# Patient Record
Sex: Male | Born: 1935 | Race: White | Hispanic: No | Smoking: Former smoker
Health system: Southern US, Community
[De-identification: ages and names within clinical notes are randomized; demographics above are authoritative.]

## PROBLEM LIST (undated history)

## (undated) DIAGNOSIS — E7800 Pure hypercholesterolemia, unspecified: Secondary | ICD-10-CM

## (undated) DIAGNOSIS — I2510 Atherosclerotic heart disease of native coronary artery without angina pectoris: Secondary | ICD-10-CM

## (undated) DIAGNOSIS — I878 Other specified disorders of veins: Secondary | ICD-10-CM

## (undated) DIAGNOSIS — G459 Transient cerebral ischemic attack, unspecified: Secondary | ICD-10-CM

## (undated) DIAGNOSIS — I509 Heart failure, unspecified: Secondary | ICD-10-CM

## (undated) DIAGNOSIS — M109 Gout, unspecified: Secondary | ICD-10-CM

## (undated) DIAGNOSIS — I4891 Unspecified atrial fibrillation: Secondary | ICD-10-CM

## (undated) DIAGNOSIS — I10 Essential (primary) hypertension: Secondary | ICD-10-CM

## (undated) DIAGNOSIS — R42 Dizziness and giddiness: Secondary | ICD-10-CM

## (undated) DIAGNOSIS — I219 Acute myocardial infarction, unspecified: Secondary | ICD-10-CM

## (undated) HISTORY — PX: OTHER SURGICAL HISTORY: SHX169

## (undated) HISTORY — PX: BACK SURGERY: SHX140

## (undated) HISTORY — PX: CARDIAC CATHETERIZATION: SHX172

---
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
## 2007-07-27 DIAGNOSIS — G459 Transient cerebral ischemic attack, unspecified: Secondary | ICD-10-CM

## 2007-07-27 HISTORY — DX: Transient cerebral ischemic attack, unspecified: G45.9

## 2012-07-26 HISTORY — PX: BACK SURGERY: SHX140

## 2013-03-05 ENCOUNTER — Ambulatory Visit (INDEPENDENT_AMBULATORY_CARE_PROVIDER_SITE_OTHER): Payer: Medicare Other | Admitting: Internal Medicine

## 2013-03-07 ENCOUNTER — Ambulatory Visit (INDEPENDENT_AMBULATORY_CARE_PROVIDER_SITE_OTHER): Payer: Self-pay | Admitting: Internal Medicine

## 2013-03-12 ENCOUNTER — Ambulatory Visit (INDEPENDENT_AMBULATORY_CARE_PROVIDER_SITE_OTHER): Payer: Medicare Other | Admitting: Internal Medicine

## 2013-10-09 ENCOUNTER — Encounter (INDEPENDENT_AMBULATORY_CARE_PROVIDER_SITE_OTHER): Payer: Self-pay | Admitting: Specialist

## 2013-10-10 ENCOUNTER — Ambulatory Visit (INDEPENDENT_AMBULATORY_CARE_PROVIDER_SITE_OTHER): Payer: Medicare Other | Admitting: Specialist

## 2013-10-10 ENCOUNTER — Encounter (INDEPENDENT_AMBULATORY_CARE_PROVIDER_SITE_OTHER): Payer: Self-pay | Admitting: Specialist

## 2013-10-10 VITALS — BP 136/76 | HR 55 | Ht 68.0 in | Wt 206.0 lb

## 2013-10-10 DIAGNOSIS — I872 Venous insufficiency (chronic) (peripheral): Secondary | ICD-10-CM

## 2013-10-10 HISTORY — DX: Venous insufficiency (chronic) (peripheral): I87.2

## 2013-10-10 NOTE — Progress Notes (Signed)
[facility]    Chief Complaint   Patient presents with   . Follow-up     ultrasound ll venous dopple FRC         History of Present Illness     S/p bilateral long saphenous ablation has no complaints all of his symptoms have resolved      Past Medical History     Past Medical History   Diagnosis Date   . Venous insufficiency 10/10/2013       Allergies     No Known Allergies    Medications     No current outpatient prescriptions on file prior to visit.       Review of Systems     Constitutional: Negative for fevers and chills  Skin: No rash or lesions  Respiratory: Negative for cough, wheezing, or hemoptysis  Cardiovascular: as per HPI  Gastrointestinal: Negative for abdominal pain, nausea, vomiting and diarrhea  Musculoskeletal:  No arthritic symptoms  Genitourinary: Negative for dysuria  All other systems were reviewed and are negative      Physical Exam     Filed Vitals:    10/10/13 1404   BP: 136/76   Pulse: 55       Body mass index is 31.33 kg/(m^2).    General: Patient appears their stated age, well-nourished. Alert and in no apparent distress.  HEENT: No conjunctivitis, no purulent discharge, no lid lag, non icteric scerlae, EOMI, Hearing grossly intact, Nares patent bilaterally, Lips moist, color appropriate for race.  Lungs: Respiratory effort unlabored, chest expansion symmetric.  Cardiac: RRR, no carotid bruits, no JVD. Extremities warm, Right Femoral pulses 2+, Left Femoral 2+, Right popliteal 2+, Left popliteal 2+, Right DP 2+, Left DP 2+, Right PT 2+, Left PT 2+, no peripheral edema:Abd: Soft, nondistended, nontender. No guarding or rebound, No mid line pulsatile mass   UVO:ZDGU ROM, symmetric  Skin: Color appropriate for race, Skin warm, dry, no gangrene, no non healing ulcers, no varicose veins , no hyperpigmentation, no lipo-dermatosclerosis  Neuro: Good insight and judgment, oriented to person, place, and time CN II-XII intact, gross motor and sensory intact      Labs     CBC:   No  results found for this basename: WBC, RBC, HGB, HCT, MCV, MCHC, RDW, PLT       CMP:   No results found for this basename: NA, K, CL, CO2, GLU, BUN, CREATININE, CALCIUM, PROT, ALBUMIN, BILITOT, ALKPHOS, AST, ALT, ANIONGAP, GRFNONAFAMER, GFRAFAMER       Lipid Panel   No results found for this basename: chol, trig, hdl, ldlc, vldlc       Coags:   No results found for this basename: PT, INR, PTT       Assessment and Plan       1. Venous insufficiency        Doing well post ablation therapy. Follow up as needed.

## 2014-04-10 ENCOUNTER — Inpatient Hospital Stay: Payer: Medicare Other | Admitting: Rehabilitative and Restorative Service Providers"

## 2014-04-12 ENCOUNTER — Ambulatory Visit: Payer: Medicare Other

## 2014-04-16 ENCOUNTER — Ambulatory Visit: Payer: Medicare Other

## 2014-04-18 ENCOUNTER — Ambulatory Visit: Payer: Medicare Other | Admitting: Rehabilitative and Restorative Service Providers"

## 2014-04-23 ENCOUNTER — Ambulatory Visit: Payer: Medicare Other

## 2014-04-25 ENCOUNTER — Ambulatory Visit: Payer: Medicare Other | Admitting: Rehabilitative and Restorative Service Providers"

## 2014-04-30 ENCOUNTER — Ambulatory Visit: Payer: Medicare Other | Admitting: Rehabilitative and Restorative Service Providers"

## 2016-02-02 ENCOUNTER — Other Ambulatory Visit: Payer: Self-pay | Admitting: Cardiology

## 2016-02-02 ENCOUNTER — Ambulatory Visit
Admission: RE | Admit: 2016-02-02 | Discharge: 2016-02-02 | Disposition: A | Discharge status: Home / Self Care | Payer: Medicare Other | Source: Ambulatory Visit | Attending: Cardiology | Admitting: Cardiology

## 2016-02-02 DIAGNOSIS — R52 Pain, unspecified: Secondary | ICD-10-CM

## 2016-05-21 ENCOUNTER — Other Ambulatory Visit: Payer: Self-pay | Admitting: Cardiology

## 2016-05-21 DIAGNOSIS — R079 Chest pain, unspecified: Secondary | ICD-10-CM

## 2016-05-31 ENCOUNTER — Encounter (HOSPITAL_COMMUNITY)
Admission: RE | Admit: 2016-05-31 | Discharge: 2016-05-31 | Disposition: A | Discharge status: Home / Self Care | Payer: Medicare Other | Source: Ambulatory Visit | Attending: Cardiology | Admitting: Cardiology

## 2016-05-31 ENCOUNTER — Ambulatory Visit (HOSPITAL_COMMUNITY)
Admission: RE | Admit: 2016-05-31 | Discharge: 2016-05-31 | Disposition: A | Discharge status: Home / Self Care | Payer: Medicare Other | Source: Ambulatory Visit | Attending: Cardiology | Admitting: Cardiology

## 2016-05-31 DIAGNOSIS — R079 Chest pain, unspecified: Secondary | ICD-10-CM | POA: Diagnosis not present

## 2016-05-31 MED ORDER — REGADENOSON 0.4 MG/5ML IV SOLN
0.4000 mg | Freq: Once | INTRAVENOUS | Status: AC
  Administered 2016-05-31: 0.4 mg via INTRAVENOUS

## 2016-05-31 MED ORDER — TECHNETIUM TC 99M TETROFOSMIN IV KIT
30.0000 | PACK | Freq: Once | INTRAVENOUS | Status: AC | PRN
  Administered 2016-05-31: 30 via INTRAVENOUS

## 2016-05-31 MED ORDER — TECHNETIUM TC 99M TETROFOSMIN IV KIT
10.0000 | PACK | Freq: Once | INTRAVENOUS | Status: AC | PRN
  Administered 2016-05-31: 10 via INTRAVENOUS

## 2016-05-31 MED ORDER — REGADENOSON 0.4 MG/5ML IV SOLN
INTRAVENOUS | Status: AC
  Filled 2016-05-31: qty 5

## 2016-06-22 ENCOUNTER — Encounter (HOSPITAL_COMMUNITY)
Admission: RE | Disposition: A | Discharge status: Home / Self Care | Payer: Self-pay | Source: Ambulatory Visit | Attending: Cardiology

## 2016-06-22 ENCOUNTER — Ambulatory Visit (HOSPITAL_COMMUNITY)
Admission: RE | Admit: 2016-06-22 | Discharge: 2016-06-22 | Disposition: A | Discharge status: Home / Self Care | Payer: Medicare Other | Source: Ambulatory Visit | Attending: Cardiology | Admitting: Cardiology

## 2016-06-22 DIAGNOSIS — E785 Hyperlipidemia, unspecified: Secondary | ICD-10-CM | POA: Diagnosis not present

## 2016-06-22 DIAGNOSIS — I252 Old myocardial infarction: Secondary | ICD-10-CM | POA: Insufficient documentation

## 2016-06-22 DIAGNOSIS — Z8673 Personal history of transient ischemic attack (TIA), and cerebral infarction without residual deficits: Secondary | ICD-10-CM | POA: Diagnosis not present

## 2016-06-22 DIAGNOSIS — Z7901 Long term (current) use of anticoagulants: Secondary | ICD-10-CM | POA: Insufficient documentation

## 2016-06-22 DIAGNOSIS — I482 Chronic atrial fibrillation: Secondary | ICD-10-CM | POA: Diagnosis not present

## 2016-06-22 DIAGNOSIS — Z6834 Body mass index (BMI) 34.0-34.9, adult: Secondary | ICD-10-CM | POA: Insufficient documentation

## 2016-06-22 DIAGNOSIS — I25119 Atherosclerotic heart disease of native coronary artery with unspecified angina pectoris: Secondary | ICD-10-CM | POA: Insufficient documentation

## 2016-06-22 DIAGNOSIS — Z823 Family history of stroke: Secondary | ICD-10-CM | POA: Insufficient documentation

## 2016-06-22 DIAGNOSIS — Z79899 Other long term (current) drug therapy: Secondary | ICD-10-CM | POA: Insufficient documentation

## 2016-06-22 DIAGNOSIS — Z87891 Personal history of nicotine dependence: Secondary | ICD-10-CM | POA: Diagnosis not present

## 2016-06-22 DIAGNOSIS — E669 Obesity, unspecified: Secondary | ICD-10-CM | POA: Diagnosis not present

## 2016-06-22 DIAGNOSIS — Z9889 Other specified postprocedural states: Secondary | ICD-10-CM | POA: Insufficient documentation

## 2016-06-22 DIAGNOSIS — M1990 Unspecified osteoarthritis, unspecified site: Secondary | ICD-10-CM | POA: Insufficient documentation

## 2016-06-22 DIAGNOSIS — Z8249 Family history of ischemic heart disease and other diseases of the circulatory system: Secondary | ICD-10-CM | POA: Diagnosis not present

## 2016-06-22 DIAGNOSIS — M109 Gout, unspecified: Secondary | ICD-10-CM | POA: Insufficient documentation

## 2016-06-22 DIAGNOSIS — I10 Essential (primary) hypertension: Secondary | ICD-10-CM | POA: Diagnosis not present

## 2016-06-22 HISTORY — PX: CARDIAC CATHETERIZATION: SHX172

## 2016-06-22 LAB — BASIC METABOLIC PANEL
ANION GAP: 10 (ref 5–15)
BUN: 34 mg/dL — ABNORMAL HIGH (ref 6–20)
CHLORIDE: 104 mmol/L (ref 101–111)
CO2: 24 mmol/L (ref 22–32)
CREATININE: 1.35 mg/dL — AB (ref 0.61–1.24)
Calcium: 9.1 mg/dL (ref 8.9–10.3)
GFR calc non Af Amer: 48 mL/min — ABNORMAL LOW (ref >60)
GFR, EST AFRICAN AMERICAN: 56 mL/min — AB (ref >60)
Glucose, Bld: 90 mg/dL (ref 65–99)
Potassium: 4.2 mmol/L (ref 3.5–5.1)
Sodium: 138 mmol/L (ref 135–145)

## 2016-06-22 LAB — CBC
HCT: 47.9 % (ref 39.0–52.0)
HEMOGLOBIN: 16.3 g/dL (ref 13.0–17.0)
MCH: 32 pg (ref 26.0–34.0)
MCHC: 34 g/dL (ref 30.0–36.0)
MCV: 93.9 fL (ref 78.0–100.0)
Platelets: 143 10*3/uL — ABNORMAL LOW (ref 150–400)
RBC: 5.1 MIL/uL (ref 4.22–5.81)
RDW: 13.7 % (ref 11.5–15.5)
WBC: 5.3 10*3/uL (ref 4.0–10.5)

## 2016-06-22 LAB — PROTIME-INR
INR: 1.41
Prothrombin Time: 17.4 seconds — ABNORMAL HIGH (ref 11.4–15.2)

## 2016-06-22 SURGERY — LEFT HEART CATH AND CORONARY ANGIOGRAPHY
Anesthesia: LOCAL

## 2016-06-22 MED ORDER — FENTANYL CITRATE (PF) 100 MCG/2ML IJ SOLN
INTRAMUSCULAR | Status: AC
  Filled 2016-06-22: qty 2

## 2016-06-22 MED ORDER — FENTANYL CITRATE (PF) 100 MCG/2ML IJ SOLN
INTRAMUSCULAR | Status: DC | PRN
  Administered 2016-06-22: 25 ug via INTRAVENOUS

## 2016-06-22 MED ORDER — MIDAZOLAM HCL 2 MG/2ML IJ SOLN
INTRAMUSCULAR | Status: DC | PRN
  Administered 2016-06-22: 1 mg via INTRAVENOUS

## 2016-06-22 MED ORDER — OXYCODONE-ACETAMINOPHEN 5-325 MG PO TABS: 1.0000 | ORAL_TABLET | ORAL | Status: DC | PRN

## 2016-06-22 MED ORDER — ASPIRIN 81 MG PO CHEW
CHEWABLE_TABLET | ORAL | Status: AC
  Administered 2016-06-22: 81 mg via ORAL
  Filled 2016-06-22: qty 1

## 2016-06-22 MED ORDER — SODIUM CHLORIDE 0.9 % WEIGHT BASED INFUSION
3.0000 mL/kg/h | INTRAVENOUS | Status: DC
  Administered 2016-06-22: 3 mL/kg/h via INTRAVENOUS

## 2016-06-22 MED ORDER — SODIUM CHLORIDE 0.9 % IV SOLN: INTRAVENOUS | Status: AC

## 2016-06-22 MED ORDER — SODIUM CHLORIDE 0.9% FLUSH: 3.0000 mL | Freq: Two times a day (BID) | INTRAVENOUS | Status: DC

## 2016-06-22 MED ORDER — SODIUM CHLORIDE 0.9 % IV SOLN: 250.0000 mL | INTRAVENOUS | Status: DC | PRN

## 2016-06-22 MED ORDER — SODIUM CHLORIDE 0.9% FLUSH: 3.0000 mL | INTRAVENOUS | Status: DC | PRN

## 2016-06-22 MED ORDER — LIDOCAINE HCL (PF) 1 % IJ SOLN
INTRAMUSCULAR | Status: DC | PRN
  Administered 2016-06-22: 15 mL

## 2016-06-22 MED ORDER — MIDAZOLAM HCL 2 MG/2ML IJ SOLN
INTRAMUSCULAR | Status: AC
  Filled 2016-06-22: qty 2

## 2016-06-22 MED ORDER — IOPAMIDOL (ISOVUE-370) INJECTION 76%
INTRAVENOUS | Status: AC
  Filled 2016-06-22: qty 100

## 2016-06-22 MED ORDER — ACETAMINOPHEN 325 MG PO TABS: 650.0000 mg | ORAL_TABLET | ORAL | Status: DC | PRN

## 2016-06-22 MED ORDER — IOPAMIDOL (ISOVUE-370) INJECTION 76%
INTRAVENOUS | Status: DC | PRN
  Administered 2016-06-22: 90 mL via INTRA_ARTERIAL

## 2016-06-22 MED ORDER — LIDOCAINE HCL (PF) 1 % IJ SOLN
INTRAMUSCULAR | Status: AC
  Filled 2016-06-22: qty 30

## 2016-06-22 MED ORDER — HEPARIN (PORCINE) IN NACL 2-0.9 UNIT/ML-% IJ SOLN
INTRAMUSCULAR | Status: DC | PRN
  Administered 2016-06-22: 1000 mL

## 2016-06-22 MED ORDER — ONDANSETRON HCL 4 MG/2ML IJ SOLN: 4.0000 mg | Freq: Four times a day (QID) | INTRAMUSCULAR | Status: DC | PRN

## 2016-06-22 MED ORDER — HEPARIN (PORCINE) IN NACL 2-0.9 UNIT/ML-% IJ SOLN
INTRAMUSCULAR | Status: AC
  Filled 2016-06-22: qty 1000

## 2016-06-22 MED ORDER — SODIUM CHLORIDE 0.9 % WEIGHT BASED INFUSION: 1.0000 mL/kg/h | INTRAVENOUS | Status: DC

## 2016-06-22 MED ORDER — SODIUM CHLORIDE 0.9 % IV SOLN
INTRAVENOUS | Status: DC | PRN
  Administered 2016-06-22: 250 mL via INTRAVENOUS

## 2016-06-22 MED ORDER — ASPIRIN 81 MG PO CHEW
81.0000 mg | CHEWABLE_TABLET | ORAL | Status: AC
  Administered 2016-06-22: 81 mg via ORAL

## 2016-06-22 SURGICAL SUPPLY — 7 items
CATH INFINITI 5FR MULTPACK ANG (CATHETERS) ×2 IMPLANT
KIT HEART LEFT (KITS) ×2 IMPLANT
PACK CARDIAC CATHETERIZATION (CUSTOM PROCEDURE TRAY) ×2 IMPLANT
SHEATH PINNACLE 5F 10CM (SHEATH) ×2 IMPLANT
SYR MEDRAD MARK V 150ML (SYRINGE) ×2 IMPLANT
TRANSDUCER W/STOPCOCK (MISCELLANEOUS) ×2 IMPLANT
WIRE EMERALD 3MM-J .035X150CM (WIRE) ×2 IMPLANT

## 2016-06-22 NOTE — H&P (Signed)
Dictated H&P in the chart needs to be scanned 

## 2016-06-22 NOTE — Progress Notes (Addendum)
Site area: RFA Site Prior to Removal:  Level 0 Pressure Applied For:30 min Manual:  yes  Patient Status During Pull:  stable Post Pull Site:  Level 0 Post Pull Instructions Given:  yes Post Pull Pulses Present: palpable Dressing Applied:  tegaderm Bedrest begins @ 1110 till 1510 Comments: long hold per request Dr Terrence Dupont

## 2016-06-22 NOTE — Interval H&P Note (Signed)
Cath Lab Visit (complete for each Cath Lab visit)  Clinical Evaluation Leading to the Procedure:   ACS: No.  Non-ACS:    Anginal Classification: CCS III  Anti-ischemic medical therapy: Maximal Therapy (2 or more classes of medications)  Non-Invasive Test Results: Intermediate-risk stress test findings: cardiac mortality 1-3%/year  Prior CABG: No previous CABG      History and Physical Interval Note:  06/22/2016 9:30 AM  [name]  has presented today for surgery, with the diagnosis of cp, abnormal stress test  The various methods of treatment have been discussed with the patient and family. After consideration of risks, benefits and other options for treatment, the patient has consented to  Procedure(s): Left Heart Cath and Coronary Angiography (N/A) as a surgical intervention .  The patient's history has been reviewed, patient examined, no change in status, stable for surgery.  I have reviewed the patient's chart and labs.  Questions were answered to the patient's satisfaction.     Jason Larson

## 2016-06-22 NOTE — Discharge Instructions (Signed)
Femoral Site Care °Introduction °Refer to this sheet in the next few weeks. These instructions provide you with information about caring for yourself after your procedure. Your health care provider may also give you more specific instructions. Your treatment has been planned according to current medical practices, but problems sometimes occur. Call your health care provider if you have any problems or questions after your procedure. °What can I expect after the procedure? °After your procedure, it is typical to have the following: °· Bruising at the site that usually fades within 1-2 weeks. °· Blood collecting in the tissue (hematoma) that may be painful to the touch. It should usually decrease in size and tenderness within 1-2 weeks. °Follow these instructions at home: °· Take medicines only as directed by your health care provider. °· You may shower 24-48 hours after the procedure or as directed by your health care provider. Remove the bandage (dressing) and gently wash the site with plain soap and water. Pat the area dry with a clean towel. Do not rub the site, because this may cause bleeding. °· Do not take baths, swim, or use a hot tub until your health care provider approves. °· Check your insertion site every day for redness, swelling, or drainage. °· Do not apply powder or lotion to the site. °· Limit use of stairs to twice a day for the first 2-3 days or as directed by your health care provider. °· Do not squat for the first 2-3 days or as directed by your health care provider. °· Do not lift over 10 lb (4.5 kg) for 5 days after your procedure or as directed by your health care provider. °· Ask your health care provider when it is okay to: °¨ Return to work or school. °¨ Resume usual physical activities or sports. °¨ Resume sexual activity. °· Do not drive home if you are discharged the same day as the procedure. Have someone else drive you. °· You may drive 24 hours after the procedure unless otherwise  instructed by your health care provider. °· Do not operate machinery or power tools for 24 hours after the procedure or as directed by your health care provider. °· If your procedure was done as an outpatient procedure, which means that you went home the same day as your procedure, a responsible adult should be with you for the first 24 hours after you arrive home. °· Keep all follow-up visits as directed by your health care provider. This is important. °Contact a health care provider if: °· You have a fever. °· You have chills. °· You have increased bleeding from the site. Hold pressure on the site. °Get help right away if: °· You have unusual pain at the site. °· You have redness, warmth, or swelling at the site. °· You have drainage (other than a small amount of blood on the dressing) from the site. °· The site is bleeding, and the bleeding does not stop after 30 minutes of holding steady pressure on the site. °· Your leg or foot becomes pale, cool, tingly, or numb. °This information is not intended to replace advice given to you by your health care provider. Make sure you discuss any questions you have with your health care provider. °Document Released: 03/15/2014 Document Revised: 12/18/2015 Document Reviewed: 01/29/2014 °© 2017 [employer]vier ° °

## 2016-06-23 ENCOUNTER — Encounter (HOSPITAL_COMMUNITY): Payer: Self-pay | Admitting: Cardiology

## 2016-08-31 ENCOUNTER — Emergency Department (HOSPITAL_COMMUNITY)
Admission: EM | Admit: 2016-08-31 | Discharge: 2016-08-31 | Disposition: A | Discharge status: Home / Self Care | Payer: Medicare Other | Attending: Emergency Medicine | Admitting: Emergency Medicine

## 2016-08-31 ENCOUNTER — Encounter (HOSPITAL_COMMUNITY): Payer: Self-pay | Admitting: Emergency Medicine

## 2016-08-31 ENCOUNTER — Emergency Department (HOSPITAL_COMMUNITY): Payer: Medicare Other

## 2016-08-31 DIAGNOSIS — Z79899 Other long term (current) drug therapy: Secondary | ICD-10-CM | POA: Insufficient documentation

## 2016-08-31 DIAGNOSIS — Z7901 Long term (current) use of anticoagulants: Secondary | ICD-10-CM | POA: Insufficient documentation

## 2016-08-31 DIAGNOSIS — I252 Old myocardial infarction: Secondary | ICD-10-CM | POA: Insufficient documentation

## 2016-08-31 DIAGNOSIS — I110 Hypertensive heart disease with heart failure: Secondary | ICD-10-CM | POA: Diagnosis not present

## 2016-08-31 DIAGNOSIS — Z7982 Long term (current) use of aspirin: Secondary | ICD-10-CM | POA: Diagnosis not present

## 2016-08-31 DIAGNOSIS — I509 Heart failure, unspecified: Secondary | ICD-10-CM | POA: Diagnosis not present

## 2016-08-31 DIAGNOSIS — R079 Chest pain, unspecified: Secondary | ICD-10-CM

## 2016-08-31 DIAGNOSIS — R0789 Other chest pain: Secondary | ICD-10-CM | POA: Insufficient documentation

## 2016-08-31 DIAGNOSIS — Z87891 Personal history of nicotine dependence: Secondary | ICD-10-CM | POA: Diagnosis not present

## 2016-08-31 HISTORY — DX: Acute myocardial infarction, unspecified: I21.9

## 2016-08-31 HISTORY — DX: Gout, unspecified: M10.9

## 2016-08-31 HISTORY — DX: Pure hypercholesterolemia, unspecified: E78.00

## 2016-08-31 HISTORY — DX: Heart failure, unspecified: I50.9

## 2016-08-31 HISTORY — DX: Essential (primary) hypertension: I10

## 2016-08-31 LAB — BASIC METABOLIC PANEL
ANION GAP: 10 (ref 5–15)
BUN: 42 mg/dL — ABNORMAL HIGH (ref 6–20)
CALCIUM: 9.4 mg/dL (ref 8.9–10.3)
CHLORIDE: 101 mmol/L (ref 101–111)
CO2: 26 mmol/L (ref 22–32)
Creatinine, Ser: 1.47 mg/dL — ABNORMAL HIGH (ref 0.61–1.24)
GFR calc non Af Amer: 43 mL/min — ABNORMAL LOW (ref >60)
GFR, EST AFRICAN AMERICAN: 50 mL/min — AB (ref >60)
GLUCOSE: 118 mg/dL — AB (ref 65–99)
POTASSIUM: 4.8 mmol/L (ref 3.5–5.1)
Sodium: 137 mmol/L (ref 135–145)

## 2016-08-31 LAB — CBC
HEMATOCRIT: 49.7 % (ref 39.0–52.0)
HEMOGLOBIN: 16.6 g/dL (ref 13.0–17.0)
MCH: 31.5 pg (ref 26.0–34.0)
MCHC: 33.4 g/dL (ref 30.0–36.0)
MCV: 94.3 fL (ref 78.0–100.0)
Platelets: 134 10*3/uL — ABNORMAL LOW (ref 150–400)
RBC: 5.27 MIL/uL (ref 4.22–5.81)
RDW: 13.6 % (ref 11.5–15.5)
WBC: 5.5 10*3/uL (ref 4.0–10.5)

## 2016-08-31 LAB — I-STAT TROPONIN, ED
TROPONIN I, POC: 0 ng/mL (ref 0.00–0.08)
Troponin i, poc: 0 ng/mL (ref 0.00–0.08)

## 2016-08-31 LAB — PROTIME-INR
INR: 2.13
Prothrombin Time: 24.1 seconds — ABNORMAL HIGH (ref 11.4–15.2)

## 2016-08-31 MED ORDER — ASPIRIN 81 MG PO CHEW
324.0000 mg | CHEWABLE_TABLET | Freq: Once | ORAL | Status: AC
  Administered 2016-08-31: 324 mg via ORAL
  Filled 2016-08-31: qty 4

## 2016-08-31 NOTE — ED Triage Notes (Signed)
Pt reports chest pain that began today, states he had a cardiac cath 6 weeks ago and was told he had a blockage. Pt reports sob and dizziness for the past few days. Pt a/ox4.

## 2016-08-31 NOTE — ED Provider Notes (Signed)
[facility] DEPT Provider Note   CSN: [id number] Arrival date & time: 08/31/16  1655     History   Chief Complaint Chief Complaint  Patient presents with  . Chest Pain    HPI Jason Larson is a 82 y.o. male with a past medical history significant for hypercholesterolemia, atrial fibrillation on Coumadin therapy, CHF, CAD with prior MI status post multiple PCI who presents with chest pain, diaphoresis, and some lightheadedness. Patient is accompanied by wife who together report that the patient was of normal health today until approximately 4:30 PM when he had onset of chest discomfort. 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 He said the pressure is located in his central chest and feels like "someone is sitting on my chest". He says the pressure radiates towards his neck and jaw. He is associated diaphoresis with this. He describes the pain as a 9 out of 10 at maximum severity. He says he is currently chest pain-free after arriving to the emergency department. He reported some mild shortness of breath with it initially. He denied nausea, vomiting, fevers, chills, cough, constipation, diarrhea, dysuria. He denies any chest trauma. He says that he did not have any muscle type pain when he was moving the water but the pain started when he was sitting in his car later.  Patient reports that he had a recent heart catheterization 6 weeks ago that showed his prior stents and some stenosis but he did not require intervention at that time. He says that he did not have chest pain at that time but had an abnormality on EKG prompting the catheterization. He denies any other preceding symptoms and says that he feels at his baseline currently.   HPI  Past Medical History:  Diagnosis Date  . CHF (congestive heart failure) ([place])   . Gout   . Heart attack   . High cholesterol   . Hypertension     There are no active problems  to display for this patient.   Past Surgical History:  Procedure Laterality Date  . BACK SURGERY    . CARDIAC CATHETERIZATION N/A 06/22/2016   Procedure: Left Heart Cath and Coronary Angiography;  Surgeon: Charolette Forward, MD;  Location: [facility];  Service: Cardiovascular;  Laterality: N/A;  . CARDIAC CATHETERIZATION         Home Medications    Prior to Admission medications   Medication Sig Start Date End Date Taking? Authorizing Provider  allopurinol (ZYLOPRIM) 100 MG tablet Take 100 mg by mouth daily.    Historical Provider, MD  amLODipine (NORVASC) 10 MG tablet Take 10 mg by mouth daily.    Historical Provider, MD  aspirin 81 MG tablet Take 81 mg by mouth daily.    Historical Provider, MD  bumetanide (BUMEX) 1 MG tablet Take 1 mg by mouth daily.    Historical Provider, MD  carvedilol (COREG) 6.25 MG tablet Take 6.25 mg by mouth 2 (two) times daily with a meal.    Historical Provider, MD  Coenzyme Q10 (CO Q 10 PO) Take 1 capsule by mouth daily.    Historical Provider, MD  Cyanocobalamin (VITAMIN B-12 PO) Take 1 tablet by mouth every morning.    Historical Provider, MD  gemfibrozil (LOPID) 600 MG tablet Take 600 mg by mouth daily.    Historical Provider, MD  losartan (COZAAR) 100 MG tablet Take 100 mg by mouth daily.    Historical Provider, MD  Magnesium 400 MG TABS Take 400 mg by mouth daily.    Historical Provider, MD  Omega-3 Fatty Acids (FISH OIL PO) Take 1 capsule by mouth daily.    Historical Provider, MD  pravastatin (PRAVACHOL) 40 MG tablet Take 40 mg by mouth daily.    Historical Provider, MD  warfarin (COUMADIN) 3 MG tablet Take 3 mg by mouth See admin instructions. Take 3 mg by mouth every day except Sunday.    Historical Provider, MD    Family History No family history on file.  Social History Social History  Substance Use Topics  . Smoking status: Former Research scientist (life sciences)  . Smokeless tobacco: Not on file  . Alcohol use No     Allergies   Patient has no known  allergies.   Review of Systems Review of Systems  Constitutional: Positive for diaphoresis. Negative for chills, fatigue and fever.  HENT: Negative for congestion and rhinorrhea.   Respiratory: Positive for chest tightness and shortness of breath. Negative for cough, wheezing and stridor.   Cardiovascular: Positive for chest pain. Negative for palpitations and leg swelling.  Gastrointestinal: Negative for abdominal pain, constipation, diarrhea, nausea and vomiting.  Genitourinary: Negative for frequency.  Musculoskeletal: Negative for back pain, neck pain and neck stiffness.  Skin: Negative for rash and wound.  Neurological: Positive for light-headedness. Negative for syncope, facial asymmetry and headaches.  Psychiatric/Behavioral: Negative for agitation.  All other systems reviewed and are negative.    Physical Exam Updated Vital Signs BP 133/77   Pulse 67   Resp 18   Ht 5\' 8"  (1.727 m)   Wt 210 lb (95.3 kg)   SpO2 96%   BMI 31.93 kg/m   Physical Exam  Constitutional: He appears well-developed and well-nourished.  HENT:  Head: Normocephalic and atraumatic.  Right Ear: External ear normal.  Left Ear: External ear normal.  Nose: Nose normal.  Mouth/Throat: Oropharynx is clear and moist. No oropharyngeal exudate.  Eyes: Conjunctivae and EOM are normal. Pupils are equal, round, and reactive to light.  Neck: Normal range of motion. Neck supple.  Cardiovascular: Normal rate, regular rhythm, normal heart sounds and intact distal pulses.   No murmur heard. Pulmonary/Chest: Effort normal and breath sounds normal. No respiratory distress. He has no wheezes. He has no rales. He exhibits no tenderness.  Abdominal: Soft. There is no tenderness.  Musculoskeletal: He exhibits no edema or tenderness.  Neurological: He is alert. No sensory deficit. He exhibits normal muscle tone.  Skin: Skin is warm and dry. Capillary refill takes less than 2 seconds. No erythema. No pallor.    Psychiatric: He has a normal mood and affect.  Nursing note and vitals reviewed.    ED Treatments / Results  Labs (all labs ordered are listed, but only abnormal results are displayed) Labs Reviewed  BASIC METABOLIC PANEL - Abnormal; Notable for the following:       Result Value   Glucose, Bld 118 (*)    BUN 42 (*)    Creatinine, Ser 1.47 (*)    GFR calc non Af Amer 43 (*)    GFR calc Af Amer 50 (*)    All other components within normal limits  CBC - Abnormal; Notable for the following:    Platelets 134 (*)    All other components within normal limits  PROTIME-INR - Abnormal; Notable for the following:    Prothrombin Time 24.1 (*)    All other components within normal limits  I-STAT TROPOININ, ED  Randolm Idol, ED  EKG  EKG Interpretation  Date/Time:  Tuesday August 31 2016 17:05:50 EST Ventricular Rate:  65 PR Interval:    QRS Duration: 108 QT Interval:  414 QTC Calculation: 430 R Axis:   87 Text Interpretation:  Atrial fibrillation with premature ventricular or aberrantly conducted complexes Possible Inferior infarct , age undetermined Cannot rule out Anterior infarct , age undetermined Abnormal ECG When compared ti prior, no significant changes seen.  No STEMI Confirmed by Sherry Ruffing MD, [facility] [phone]) on 08/31/2016 8:47:04 PM       Radiology Dg Chest 2 View  Result Date: 08/31/2016 CLINICAL DATA:  Chest pain.  Jaw pain and shortness of breath. EXAM: CHEST  2 VIEW COMPARISON:  None. FINDINGS: The cardiac silhouette is mildly enlarged. Aortic atherosclerosis is noted. There are ill-defined densities laterally in the mid lungs bilaterally on the PA radiograph with density suggestive of calcification and which may reflect pleural plaques. A 12 mm nodule is questioned in the left upper lobe. No airspace consolidation, edema, pleural effusion, or pneumothorax is identified. No acute osseous abnormality is seen. IMPRESSION: 1. No evidence of acute cardiopulmonary  process. 2. Possible left upper lobe lung nodule and possible bilateral calcified pleural plaques. Nonurgent chest CT as an outpatient is suggested for further evaluation. Electronically Signed   By: Logan Bores M.D.   On: 08/31/2016 17:40    Procedures Procedures (including critical care time)  Medications Ordered in ED Medications  aspirin chewable tablet 324 mg (324 mg Oral Given 08/31/16 2115)     Initial Impression / Assessment and Plan / ED Course  I have reviewed the triage vital signs and the nursing notes.  Pertinent labs & imaging results that were available during my care of the patient were reviewed by me and considered in my medical decision making (see chart for details).     Wrigley Lema is a 82 y.o. male with a past medical history significant for hypercholesterolemia, atrial fibrillation on Coumadin therapy, CHF, CAD with prior MI status post multiple PCI who presents with chest pain, diaphoresis, and some lightheadedness.   History and exam are seen above.  On exam, patient had no chest tenderness. Lungs are clear to auscultation. Patient had no evidence of otitis media on left ear exam. Patient did not have tenderness on his neck. Abdomen was nontender. Patient had symmetric pulses.  Initial EKG showed fibrillation with no significant ischemic changes identified.   Patient had initial workup with laboratory testing showed negative troponin. Chest x-ray showed no evidence of acute croup ulnar process. There was evidence of a possible left upper lung nodule and bilateral calcified plaques that nonurgent chest CT as an outpatient was recommended.  He will be reassessed after delta troponin.   Delta troponin was negative. Creatinine slightly elevated from prior but labs were otherwise unremarkable.  Patient advised that he needs to be admitted for further management of his high risk chest pain he says the pain feels similar to prior MI.  After patient was told to  recommend patient to admission, patient says that he is unable to be admitted to the hospital tonight because of his sick dog at home. Patient says that he will see his cardiologist tomorrow and already has an appointment scheduled. He says that he will return if he has any continued or worsened symptoms. Patient continues to be chest pain-free during the entirety of his ED stay.  Do not feel patient needs to sign out [place] as the patient appears extremely reliable,  already has a cardiology visit, understands return precautions and verbalized understanding of the risks of death if he leaves without further management tonight. Patient understands risks of discharge and says that he will immediately return if he has return of symptoms. Patient and family understood plan of care and was discharged in stable condition.    Final Clinical Impressions(s) / ED Diagnoses   Final diagnoses:  Chest pain, unspecified type    New Prescriptions Discharge Medication List as of 08/31/2016 10:39 PM      Clinical Impression: 1. Chest pain, unspecified type     Disposition: Discharge  Condition: Stable  I have discussed the results, Dx and Tx plan with the pt(& family if present). He/she/they expressed understanding and agree(s) with the plan. Discharge instructions discussed at great length. Strict return precautions discussed and pt &/or family have verbalized understanding of the instructions. No further questions at time of discharge.    Discharge Medication List as of 08/31/2016 10:39 PM      Follow Up: Charolette Forward, MD [address] [place] [address] [phone]  Go in 1 day      Courtney Paris, MD 08/31/16 [phone]

## 2016-08-31 NOTE — Discharge Instructions (Signed)
Please go to your appointment with your cardiologist tomorrow. Although we are recommending admission for your high risk chest pain, with understanding or need to go home tonight. Please return to the emergency department immediately if you begin having any return of symptoms. Please go to your appointment tomorrow and also follow-up with her PCP for further CT imaging of your possible pulmonary nodule.

## 2016-12-08 ENCOUNTER — Other Ambulatory Visit: Payer: Self-pay | Admitting: Gastroenterology

## 2016-12-09 ENCOUNTER — Encounter (HOSPITAL_COMMUNITY): Payer: Self-pay

## 2016-12-10 ENCOUNTER — Encounter (HOSPITAL_COMMUNITY)
Admission: RE | Disposition: A | Discharge status: Home / Self Care | Payer: Self-pay | Source: Ambulatory Visit | Attending: Gastroenterology

## 2016-12-10 ENCOUNTER — Ambulatory Visit (HOSPITAL_COMMUNITY): Payer: Medicare Other | Admitting: Anesthesiology

## 2016-12-10 ENCOUNTER — Encounter (HOSPITAL_COMMUNITY): Payer: Self-pay

## 2016-12-10 ENCOUNTER — Ambulatory Visit (HOSPITAL_COMMUNITY)
Admission: RE | Admit: 2016-12-10 | Discharge: 2016-12-10 | Disposition: A | Discharge status: Home / Self Care | Payer: Medicare Other | Source: Ambulatory Visit | Attending: Gastroenterology | Admitting: Gastroenterology

## 2016-12-10 DIAGNOSIS — I509 Heart failure, unspecified: Secondary | ICD-10-CM | POA: Diagnosis not present

## 2016-12-10 DIAGNOSIS — I110 Hypertensive heart disease with heart failure: Secondary | ICD-10-CM | POA: Insufficient documentation

## 2016-12-10 DIAGNOSIS — E7800 Pure hypercholesterolemia, unspecified: Secondary | ICD-10-CM | POA: Insufficient documentation

## 2016-12-10 DIAGNOSIS — M109 Gout, unspecified: Secondary | ICD-10-CM | POA: Insufficient documentation

## 2016-12-10 DIAGNOSIS — I252 Old myocardial infarction: Secondary | ICD-10-CM | POA: Diagnosis not present

## 2016-12-10 DIAGNOSIS — Z87891 Personal history of nicotine dependence: Secondary | ICD-10-CM | POA: Diagnosis not present

## 2016-12-10 DIAGNOSIS — Z79899 Other long term (current) drug therapy: Secondary | ICD-10-CM | POA: Diagnosis not present

## 2016-12-10 DIAGNOSIS — K219 Gastro-esophageal reflux disease without esophagitis: Secondary | ICD-10-CM | POA: Insufficient documentation

## 2016-12-10 DIAGNOSIS — R1310 Dysphagia, unspecified: Secondary | ICD-10-CM | POA: Diagnosis present

## 2016-12-10 HISTORY — PX: ESOPHAGOGASTRODUODENOSCOPY (EGD) WITH PROPOFOL: SHX5813

## 2016-12-10 SURGERY — ESOPHAGOGASTRODUODENOSCOPY (EGD) WITH PROPOFOL
Anesthesia: Monitor Anesthesia Care

## 2016-12-10 MED ORDER — SODIUM CHLORIDE 0.9 % IV SOLN: INTRAVENOUS | Status: DC

## 2016-12-10 MED ORDER — LIDOCAINE 2% (20 MG/ML) 5 ML SYRINGE
INTRAMUSCULAR | Status: AC
  Filled 2016-12-10: qty 5

## 2016-12-10 MED ORDER — LIDOCAINE HCL (CARDIAC) 20 MG/ML IV SOLN
INTRAVENOUS | Status: DC | PRN
  Administered 2016-12-10: 50 mg via INTRAVENOUS

## 2016-12-10 MED ORDER — LACTATED RINGERS IV SOLN
INTRAVENOUS | Status: DC
  Administered 2016-12-10: 12:00:00 via INTRAVENOUS

## 2016-12-10 MED ORDER — PROPOFOL 500 MG/50ML IV EMUL
INTRAVENOUS | Status: DC | PRN
  Administered 2016-12-10: 75 ug/kg/min via INTRAVENOUS

## 2016-12-10 MED ORDER — PROPOFOL 10 MG/ML IV BOLUS
INTRAVENOUS | Status: AC
  Filled 2016-12-10: qty 20

## 2016-12-10 SURGICAL SUPPLY — 14 items

## 2016-12-10 NOTE — Discharge Instructions (Signed)

## 2016-12-10 NOTE — Op Note (Signed)
[facility] Patient Name: Jason Larson Procedure Date: 12/10/2016 MRN: [id number] Attending MD: Carol Ada , MD Date of Birth: August 09, 1934 CSN: [id number] Age: 82 Admit Type: Outpatient Procedure:                Upper GI endoscopy Indications:              Dysphagia Providers:                Carol Ada, MD, Carmie End, RN, Lillie Fragmin, RN, William Dalton, Technician Referring MD:              Medicines:                Propofol per Anesthesia Complications:            No immediate complications. Estimated Blood Loss:     Estimated blood loss was minimal. Procedure:                Pre-Anesthesia Assessment:                           - Prior to the procedure, a History and Physical                            was performed, and patient medications and                            allergies were reviewed. The patient's tolerance of                            previous anesthesia was also reviewed. The risks                            and benefits of the procedure and the sedation                            options and risks were discussed with the patient.                            All questions were answered, and informed consent                            was obtained. Prior Anticoagulants: The patient has                            taken no previous anticoagulant or antiplatelet                            agents. ASA Grade Assessment: II - A patient with                            mild systemic disease. After reviewing the risks  and benefits, the patient was deemed in                            satisfactory condition to undergo the procedure.                           - Sedation was administered by an anesthesia                            professional. Deep sedation was attained.                           After obtaining informed consent, the endoscope was                            passed under direct  vision. Throughout the                            procedure, the patient's blood pressure, pulse, and                            oxygen saturations were monitored continuously. The                            [id number]) scope was introduced through the                            mouth, and advanced to the second part of duodenum.                            The upper GI endoscopy was accomplished without                            difficulty. The patient tolerated the procedure                            well. Findings:      The lower third of the esophagus was moderately tortuous. A guidewire       was placed and the scope was withdrawn. Dilation was performed with a       Savary dilator with no resistance at 18 mm. Estimated blood loss: none.       Biopsies were taken with a cold forceps for histology.      The stomach was normal.      The examined duodenum was normal. Impression:               - Tortuous esophagus.                           - Normal stomach.                           - Normal examined duodenum. Moderate Sedation:      N/A- Per Anesthesia Care      N/A- Per Anesthesia Care Recommendation:           -  Patient has a contact number available for                            emergencies. The signs and symptoms of potential                            delayed complications were discussed with the                            patient. Return to normal activities tomorrow.                            Written discharge instructions were provided to the                            patient.                           - Resume previous diet.                           - Continue present medications.                           - Await pathology results.                           - Return to GI clinic in 4 weeks.                           - If the dysphagia has not resolved, I will pursue                            a manometry to evaluate for Achlasia. Procedure Code(s):        ---  Professional ---                           [phone], Esophagogastroduodenoscopy, flexible,                            transoral; with insertion of guide wire followed by                            passage of dilator(s) through esophagus over guide                            wire                           [id number], Esophagogastroduodenoscopy, flexible,                            transoral; with biopsy, single or multiple Diagnosis Code(s):        --- Professional ---  Q39.9, Congenital malformation of esophagus,                            unspecified                           R13.10, Dysphagia, unspecified CPT copyright 2016 American [employer] Association. All rights reserved. The codes documented in this report are preliminary and upon coder review may  be revised to meet current compliance requirements. Carol Ada, MD Carol Ada, MD 12/10/2016 12:20:45 PM This report has been signed electronically. Number of Addenda: 0

## 2016-12-10 NOTE — H&P (Signed)
Jason Larson HPI: He reports having a "sticky throat" for the past 3 months. In spite of his dysphagia, he has gained weight. Per his estimation he has gained 10 lbs. The patient has issues with solid foods, but recently he had to start adding liquids to help pass the food bolus. No problems with nausea or vomiting. He has a 40 year history of smoking, starting at the age of 34, but he quiet in 71. The patient did drink ETOH, but it was never excessive. Recently he has noticed an increase in using TUMS, which resolves his GERD symptoms. No prior EGD.  Past Medical History:  Diagnosis Date  . CHF (congestive heart failure) ([place])   . Gout   . Heart attack ([place])   . High cholesterol   . Hypertension     Past Surgical History:  Procedure Laterality Date  . BACK SURGERY    . CARDIAC CATHETERIZATION N/A 06/22/2016   Procedure: Left Heart Cath and Coronary Angiography;  Surgeon: Charolette Forward, MD;  Location: [place];  Service: Cardiovascular;  Laterality: N/A;  . CARDIAC CATHETERIZATION      History reviewed. No pertinent family history.  Social History:  reports that he quit smoking about 28 years ago. He has never used smokeless tobacco. He reports that he does not drink alcohol or use drugs.  Allergies: No Known Allergies  Medications: Scheduled: Continuous:  No results found for this or any previous visit (from the past 24 hour(s)).   No results found.  ROS:  As stated above in the HPI otherwise negative.  There were no vitals taken for this visit.    PE: Gen: NAD, Alert and Oriented HEENT:  [place]/AT, EOMI Neck: Supple, no LAD Lungs: CTA Bilaterally CV: RRR without M/G/R ABM: Soft, NTND, +BS Ext: No C/C/E  Assessment/Plan: 1) Dysphagia - this is a solid food dysphagia.  I will perform an EGD with possible dilation.  Ilo Beamon D 12/10/2016, 10:14 AM

## 2016-12-10 NOTE — Transfer of Care (Signed)
Immediate Anesthesia Transfer of Care Note  Patient: Jason Larson  Procedure(s) Performed: Procedure(s): ESOPHAGOGASTRODUODENOSCOPY (EGD) WITH PROPOFOL (N/A)  Patient Location: PACU  Anesthesia Type:MAC  Level of Consciousness: alert , oriented and sedated  Airway & Oxygen Therapy: Patient Spontanous Breathing and Patient connected to nasal cannula oxygen  Post-op Assessment: Report given to RN and Post -op Vital signs reviewed and stable  Post vital signs: Reviewed and stable  Last Vitals:  Vitals:   12/10/16 1130  BP: (!) 162/84  Pulse: (!) 46  Resp: 16  Temp: 36.7 C    Last Pain:  Vitals:   12/10/16 1130  TempSrc: Oral         Complications: No apparent anesthesia complications

## 2016-12-10 NOTE — Anesthesia Preprocedure Evaluation (Signed)
Anesthesia Evaluation  Patient identified by MRN, date of birth, ID band Patient awake    Reviewed: Allergy & Precautions, H&P , NPO status , Patient's Chart, lab work & pertinent test results  Airway Mallampati: II   Neck ROM: full    Dental   Pulmonary former smoker,    breath sounds clear to auscultation       Cardiovascular hypertension, + Past MI and +CHF   Rhythm:regular Rate:Normal     Neuro/Psych    GI/Hepatic   Endo/Other    Renal/GU      Musculoskeletal   Abdominal   Peds  Hematology   Anesthesia Other Findings   Reproductive/Obstetrics                             Anesthesia Physical Anesthesia Plan  ASA: III  Anesthesia Plan: MAC   Post-op Pain Management:    Induction: Intravenous  Airway Management Planned: Nasal Cannula  Additional Equipment:   Intra-op Plan:   Post-operative Plan:   Informed Consent: I have reviewed the patients History and Physical, chart, labs and discussed the procedure including the risks, benefits and alternatives for the proposed anesthesia with the patient or authorized representative who has indicated his/her understanding and acceptance.     Plan Discussed with: CRNA, Anesthesiologist and Surgeon  Anesthesia Plan Comments:         Anesthesia Quick Evaluation

## 2016-12-12 ENCOUNTER — Encounter (HOSPITAL_COMMUNITY): Payer: Self-pay | Admitting: Gastroenterology

## 2016-12-15 NOTE — Anesthesia Postprocedure Evaluation (Signed)
Anesthesia Post Note  Patient: Jason Larson  Procedure(s) Performed: Procedure(s) (LRB): ESOPHAGOGASTRODUODENOSCOPY (EGD) WITH PROPOFOL (N/A)  Patient location during evaluation: PACU Anesthesia Type: MAC Level of consciousness: awake and alert Pain management: pain level controlled Vital Signs Assessment: post-procedure vital signs reviewed and stable Respiratory status: spontaneous breathing, nonlabored ventilation, respiratory function stable and patient connected to nasal cannula oxygen Cardiovascular status: stable and blood pressure returned to baseline Anesthetic complications: no       Last Vitals:  Vitals:   12/10/16 1230 12/10/16 1240  BP: 135/78 (!) 135/92  Pulse: (!) 57 (!) 53  Resp: 20 (!) 27  Temp:      Last Pain:  Vitals:   12/13/16 1035  TempSrc:   PainSc: 0-No pain                 Marsa Matteo S

## 2018-05-18 ENCOUNTER — Emergency Department (HOSPITAL_COMMUNITY)
Admission: EM | Admit: 2018-05-18 | Discharge: 2018-05-18 | Disposition: A | Discharge status: Home / Self Care | Payer: Medicare Other | Attending: Emergency Medicine | Admitting: Emergency Medicine

## 2018-05-18 ENCOUNTER — Encounter (HOSPITAL_COMMUNITY): Payer: Self-pay

## 2018-05-18 DIAGNOSIS — I509 Heart failure, unspecified: Secondary | ICD-10-CM | POA: Diagnosis not present

## 2018-05-18 DIAGNOSIS — Z87891 Personal history of nicotine dependence: Secondary | ICD-10-CM | POA: Diagnosis not present

## 2018-05-18 DIAGNOSIS — Z7982 Long term (current) use of aspirin: Secondary | ICD-10-CM | POA: Insufficient documentation

## 2018-05-18 DIAGNOSIS — R42 Dizziness and giddiness: Secondary | ICD-10-CM | POA: Insufficient documentation

## 2018-05-18 DIAGNOSIS — Z7901 Long term (current) use of anticoagulants: Secondary | ICD-10-CM | POA: Diagnosis not present

## 2018-05-18 DIAGNOSIS — I110 Hypertensive heart disease with heart failure: Secondary | ICD-10-CM | POA: Diagnosis not present

## 2018-05-18 DIAGNOSIS — Z79899 Other long term (current) drug therapy: Secondary | ICD-10-CM | POA: Insufficient documentation

## 2018-05-18 LAB — BASIC METABOLIC PANEL
Anion gap: 9 (ref 5–15)
BUN: 33 mg/dL — ABNORMAL HIGH (ref 8–23)
CHLORIDE: 103 mmol/L (ref 98–111)
CO2: 25 mmol/L (ref 22–32)
CREATININE: 1.18 mg/dL (ref 0.61–1.24)
Calcium: 9.2 mg/dL (ref 8.9–10.3)
GFR calc Af Amer: >60 mL/min (ref >60)
GFR calc non Af Amer: 56 mL/min — ABNORMAL LOW (ref >60)
Glucose, Bld: 73 mg/dL (ref 70–99)
Potassium: 4.4 mmol/L (ref 3.5–5.1)
Sodium: 137 mmol/L (ref 135–145)

## 2018-05-18 LAB — URINALYSIS, ROUTINE W REFLEX MICROSCOPIC
BILIRUBIN URINE: NEGATIVE
GLUCOSE, UA: NEGATIVE mg/dL
HGB URINE DIPSTICK: NEGATIVE
KETONES UR: NEGATIVE mg/dL
LEUKOCYTES UA: NEGATIVE
Nitrite: NEGATIVE
PH: 6 (ref 5.0–8.0)
Protein, ur: NEGATIVE mg/dL
Specific Gravity, Urine: 1.006 (ref 1.005–1.030)

## 2018-05-18 LAB — CBC
HEMATOCRIT: 45.5 % (ref 39.0–52.0)
Hemoglobin: 14.6 g/dL (ref 13.0–17.0)
MCH: 31.2 pg (ref 26.0–34.0)
MCHC: 32.1 g/dL (ref 30.0–36.0)
MCV: 97.2 fL (ref 80.0–100.0)
Platelets: 193 10*3/uL (ref 150–400)
RBC: 4.68 MIL/uL (ref 4.22–5.81)
RDW: 13.3 % (ref 11.5–15.5)
WBC: 6.1 10*3/uL (ref 4.0–10.5)
nRBC: 0 % (ref 0.0–0.2)

## 2018-05-18 NOTE — ED Notes (Addendum)
Pt aware on need for urine sample, specimen cup provided

## 2018-05-18 NOTE — ED Provider Notes (Signed)
[facility] EMERGENCY DEPARTMENT Provider Note   CSN: [id number] Arrival date & time: 05/18/18  1221     History   Chief Complaint Chief Complaint  Patient presents with  . Dizziness    HPI Jason Larson is a 83 y.o. male.  83yo M w/ PMH including CHF, HTN, HLD, gout who p/w dizziness.  She has had a 61-month history of dizziness that he describes as room spinning sensation.  It often happens when he sits up and gets out of bed in the morning.  He has noticed that it is better when he tries to slowly get out of bed or he tries to slowly stand up from sitting position.  It can also happen if he looks up quickly.  He has been evaluated in the past by his PCP, who started him on meclizine which he was taking 3 times a day.  More recently, his dizziness episodes got worse so they increased the medication.  He spoke with his cardiologist today who referred him here for evaluation.  He notes that earlier this week he was blowing his nose at night before bed and felt like he "blew out" his left ear.  He denies any hearing loss or pain.  No nasal congestion or cold.  He denies vision changes, speech problems, balance problems, extremity weakness/numbness, or headaches. Currently he feels well.   The history is provided by the patient.  Dizziness    Past Medical History:  Diagnosis Date  . CHF (congestive heart failure) ([place])   . Gout   . Heart attack ([place])   . High cholesterol   . Hypertension     There are no active problems to display for this patient.   Past Surgical History:  Procedure Laterality Date  . BACK SURGERY    . CARDIAC CATHETERIZATION N/A 06/22/2016   Procedure: Left Heart Cath and Coronary Angiography;  Surgeon: Charolette Forward, MD;  Location: [facility];  Service: Cardiovascular;  Laterality: N/A;  . CARDIAC CATHETERIZATION    . ESOPHAGOGASTRODUODENOSCOPY (EGD) WITH PROPOFOL N/A 12/10/2016   Procedure: ESOPHAGOGASTRODUODENOSCOPY (EGD) WITH  PROPOFOL;  Surgeon: Carol Ada, MD;  Location: [facility];  Service: Endoscopy;  Laterality: N/A;        Home Medications    Prior to Admission medications   Medication Sig Start Date End Date Taking? Authorizing Provider  allopurinol (ZYLOPRIM) 100 MG tablet Take 100 mg by mouth daily.   Yes [provider]  amLODipine (NORVASC) 10 MG tablet Take 10 mg by mouth daily at 3 pm.    Yes [provider]  aspirin 81 MG tablet Take 81 mg by mouth at bedtime.    Yes [provider]  B Complex CAPS Take 1 capsule by mouth daily.   Yes [provider]  bumetanide (BUMEX) 1 MG tablet Take 1 mg by mouth daily.   Yes [provider]  carvedilol (COREG) 6.25 MG tablet Take 6.25 mg by mouth daily.    Yes [provider]  Coenzyme Q10 (CO Q 10 PO) Take 1 capsule by mouth daily.   Yes [provider]  gemfibrozil (LOPID) 600 MG tablet Take 600 mg by mouth daily.   Yes [provider]  losartan (COZAAR) 100 MG tablet Take 100 mg by mouth every evening.    Yes [provider]  magnesium oxide (MAG-OX) 400 MG tablet Take 400 mg by mouth daily.   Yes [provider]  meclizine (ANTIVERT) 25 MG  tablet Take 25 mg by mouth every 6 (six) hours.    Yes [provider]  Omega-3 Fatty Acids (FISH OIL PO) Take 1 capsule by mouth daily.   Yes [provider]  pravastatin (PRAVACHOL) 40 MG tablet Take 40 mg by mouth daily.    Yes [provider]  warfarin (COUMADIN) 3 MG tablet Take 3 mg by mouth See admin instructions. Take 3 mg by mouth at 6 PM daily on Mon/Tues/Wed/Thurs/Fri/Sat and nothing on Sundays   Yes [provider]    Family History History reviewed. No pertinent family history.  Social History Social History   Tobacco Use  . Smoking status: Former Smoker    Last attempt to quit: 1990    Years since quitting: 29.8  . Smokeless tobacco: Never Used  Substance Use Topics    . Alcohol use: No  . Drug use: No     Allergies   Patient has no known allergies.   Review of Systems Review of Systems  Neurological: Positive for dizziness.   All other systems reviewed and are negative except that which was mentioned in HPI   Physical Exam Updated Vital Signs BP (!) 148/70   Pulse (!) 57   Temp 98.9 F (37.2 C) (Oral)   Resp 16   Ht 5\' 7"  (1.702 m)   Wt 95.3 kg   SpO2 97%   BMI 32.89 kg/m   Physical Exam  Constitutional: He is oriented to person, place, and time. He appears well-developed and well-nourished. No distress.  Awake, alert  HENT:  Head: Normocephalic and atraumatic.  Eyes: Pupils are equal, round, and reactive to light. Conjunctivae and EOM are normal.  Neck: Neck supple.  Cardiovascular: Normal heart sounds. An irregularly irregular rhythm present. Bradycardia present.  No murmur heard. Pulmonary/Chest: Effort normal and breath sounds normal. No respiratory distress.  Abdominal: Soft. Bowel sounds are normal. He exhibits no distension. There is no tenderness.  Musculoskeletal: He exhibits no edema.  Neurological: He is alert and oriented to person, place, and time. He has normal reflexes. No cranial nerve deficit. He exhibits normal muscle tone.  Fluent speech, normal finger-to-nose testing, negative pronator drift, no clonus 5/5 strength and normal sensation x all 4 extremities  Skin: Skin is warm and dry.  Chronic venous stasis dermatitis bilateral lower legs  Psychiatric: He has a normal mood and affect. Judgment and thought content normal.  Nursing note and vitals reviewed.    ED Treatments / Results  Labs (all labs ordered are listed, but only abnormal results are displayed) Labs Reviewed  BASIC METABOLIC PANEL - Abnormal; Notable for the following components:      Result Value   BUN 33 (*)    GFR calc non Af Amer 56 (*)    All other components within normal limits  CBC  URINALYSIS, ROUTINE W REFLEX MICROSCOPIC  CBG  MONITORING, ED    EKG EKG Interpretation  Date/Time:  Thursday May 18 2018 12:53:44 EDT Ventricular Rate:  51 PR Interval:    QRS Duration: 98 QT Interval:  438 QTC Calculation: 403 R Axis:   73 Text Interpretation:  Atrial fibrillation with slow ventricular response Low voltage QRS Cannot rule out Inferior infarct , age undetermined Cannot rule out Anterior infarct , age undetermined Abnormal ECG similar to previous Confirmed by Theotis Burrow [phone]) on 05/18/2018 3:05:59 PM   Radiology No results found.  Procedures Procedures (including critical care time)  Medications Ordered in ED Medications - No data to  display   Initial Impression / Assessment and Plan / ED Course  I have reviewed the triage vital signs and the nursing notes.  Pertinent labs that were available during my care of the patient were reviewed by me and considered in my medical decision making (see chart for details).    Neurologically intact and well appearing on exam.  Vital signs reassuring.  EKG shows A. fib which is chronic.  Given the chronicity of his symptoms, normal neurologic exam today, and a clear story that head movement provokes his symptoms, I suspect peripheral vertigo such as BPPV.  He has no neurologic deficits to warrant advanced imaging today, feel he is appropriate for outpatient follow-up.  Because of the chronicity of his symptoms, I have recommended referral to ENT.  Have discussed Epley maneuver to perform at home and counseled on how to use meclizine-- for symptom treatment not prevention of symptoms.  [place] reviewed return precautions with him and he voiced understanding.  Final Clinical Impressions(s) / ED Diagnoses   Final diagnoses:  Vertigo    ED Discharge Orders    None       Jody Silas, Wenda Overland, MD 05/18/18 1656

## 2018-05-18 NOTE — ED Triage Notes (Signed)
Pt presents with dizziness x 6 months that worsened x 1 week ago.  Pt seen at PCP, given meclizine (12.5 q 4 hours) with symptoms worsening, dose was doubled but symptoms have worsened.  Pt reports blowing his nose earlier in the week that "blew out my left ear".  Pt reports closing his eyes worsens symptoms.

## 2018-12-08 IMAGING — CR DG CHEST 2V
2 series · 2 of 2 positions shown · non-contrast
Comparison: None.

CLINICAL DATA: Chest pain.  Jaw pain and shortness of breath.

EXAM:
CHEST  2 VIEW

[chest pa]
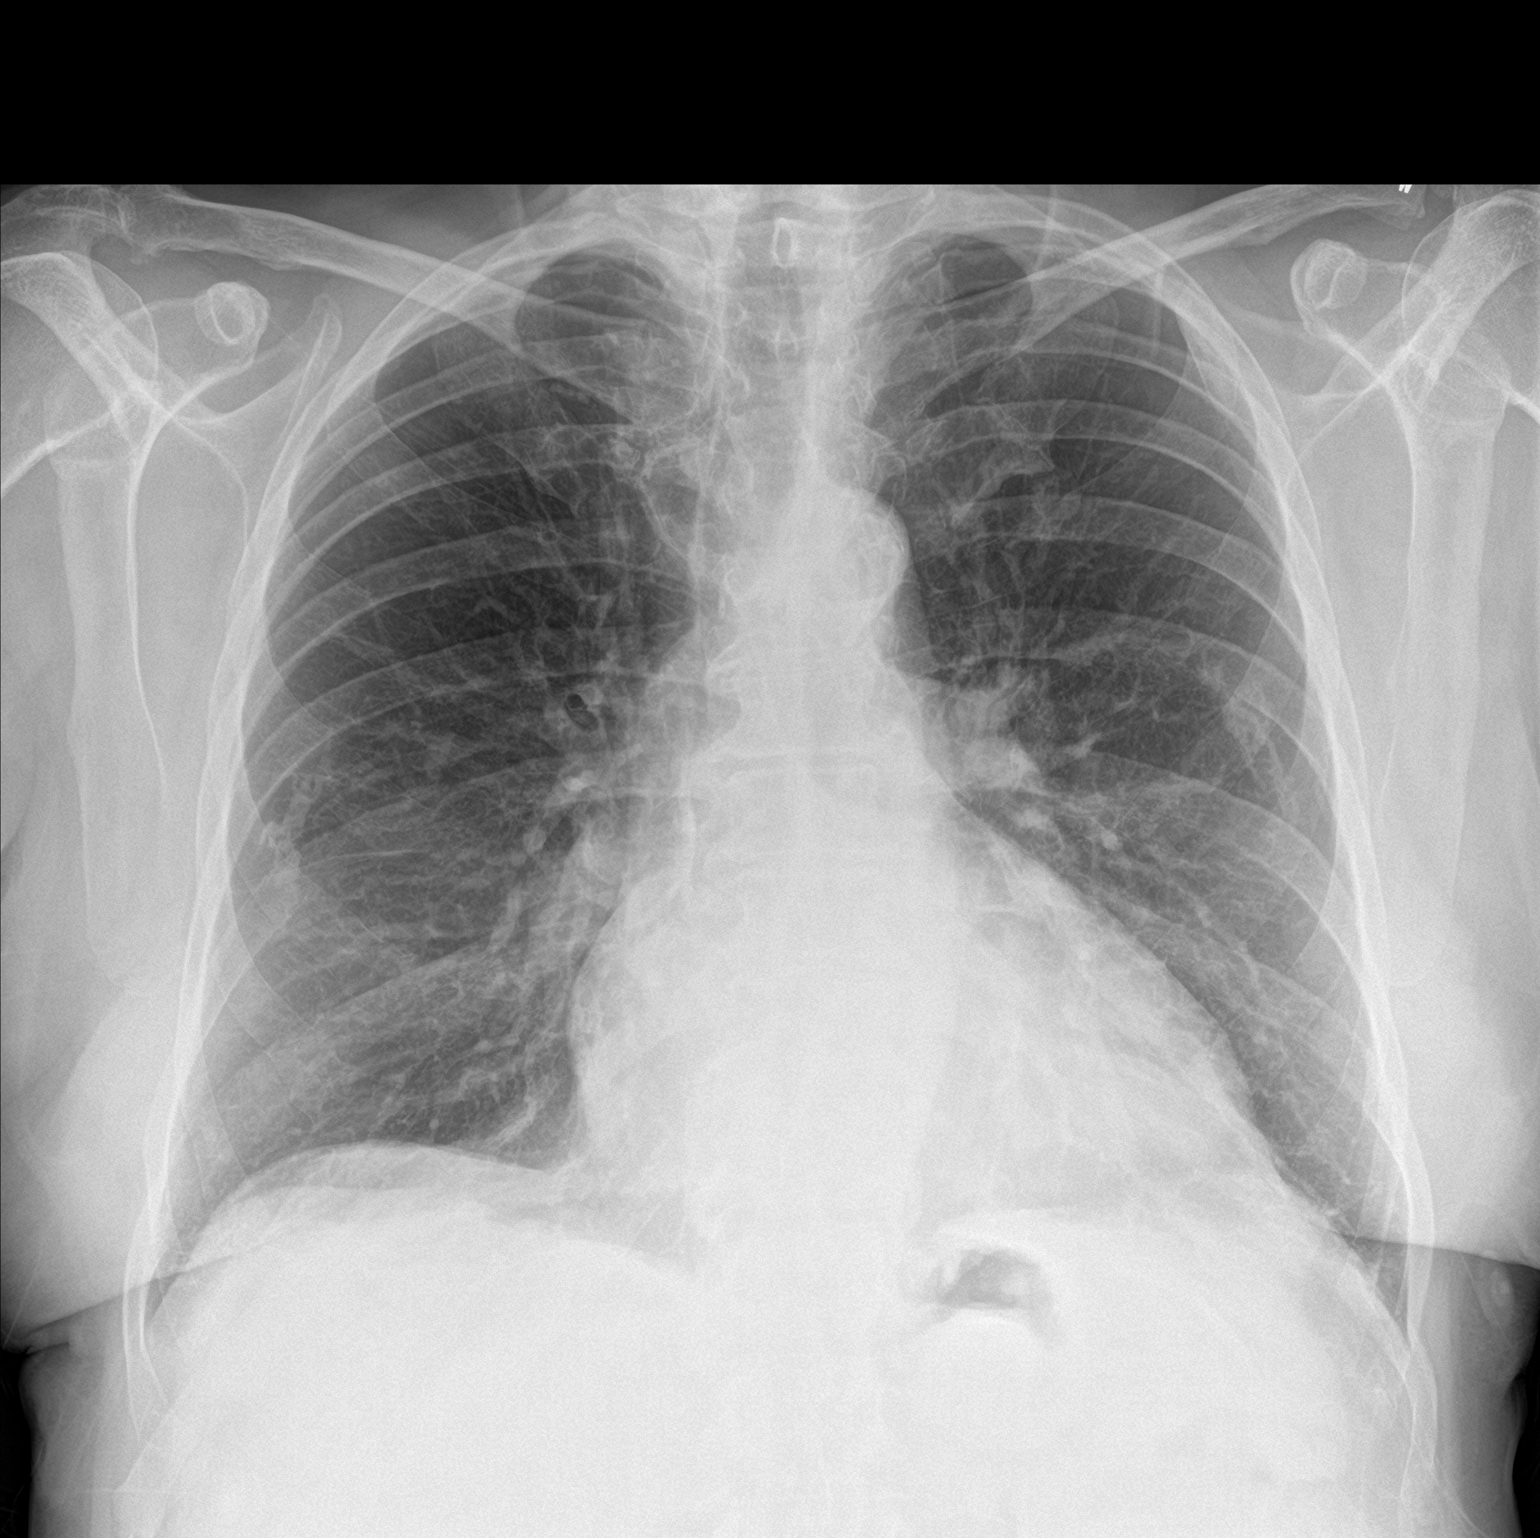

[chest lat]
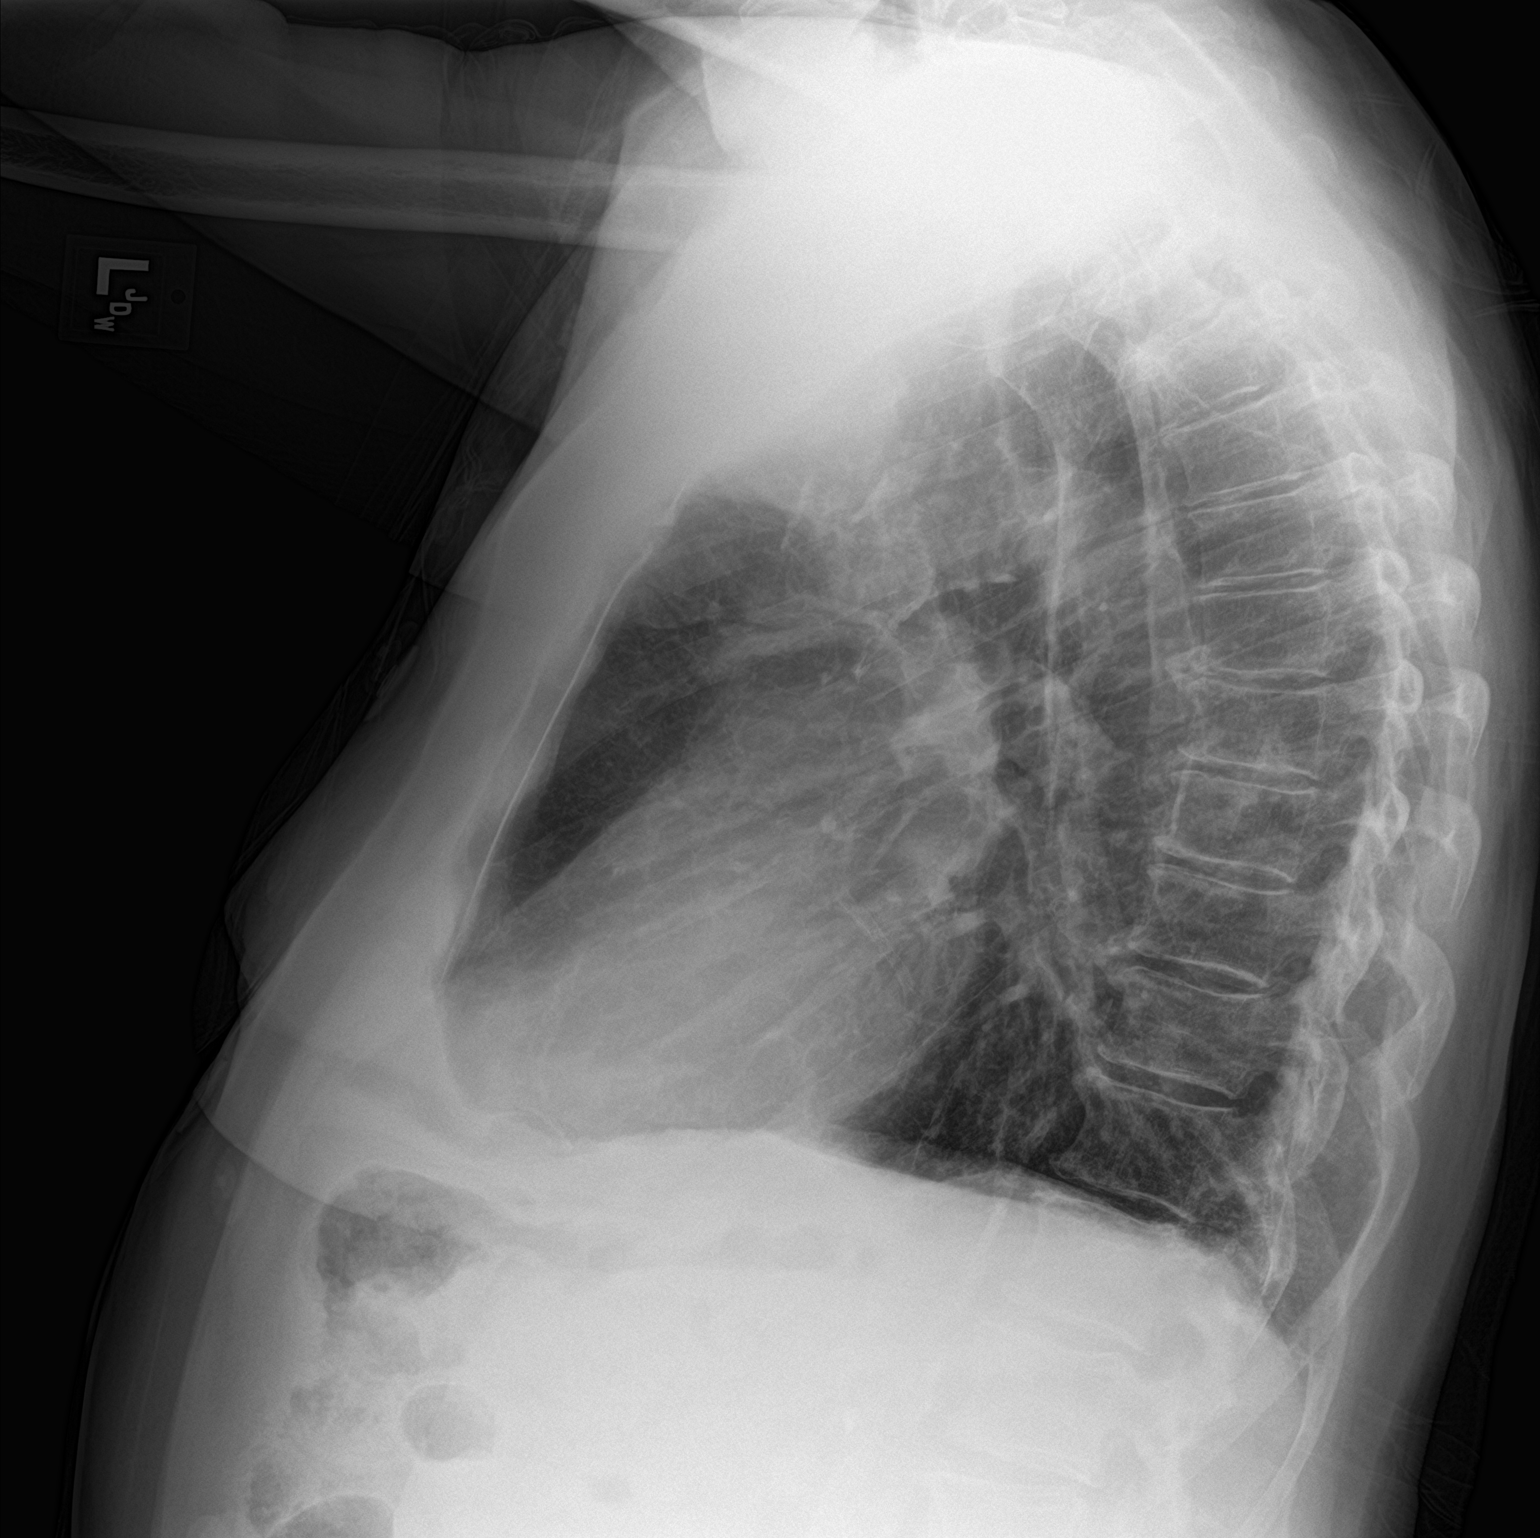

[2 of 2 positions shown; findings below may reference images not displayed]

FINDINGS: The cardiac silhouette is mildly enlarged. Aortic atherosclerosis is
noted. There are ill-defined densities laterally in the mid lungs
bilaterally on the PA radiograph with density suggestive of
calcification and which may reflect pleural plaques. A 12 mm nodule
is questioned in the left upper lobe. No airspace consolidation,
edema, pleural effusion, or pneumothorax is identified. No acute
osseous abnormality is seen.
IMPRESSION: 1. No evidence of acute cardiopulmonary process.
2. Possible left upper lobe lung nodule and possible bilateral
calcified pleural plaques. Nonurgent chest CT as an outpatient is
suggested for further evaluation.

## 2019-03-19 ENCOUNTER — Other Ambulatory Visit: Payer: Self-pay

## 2019-03-19 ENCOUNTER — Other Ambulatory Visit: Payer: Self-pay | Admitting: Gastroenterology

## 2019-03-19 ENCOUNTER — Encounter (HOSPITAL_COMMUNITY): Payer: Self-pay | Admitting: Emergency Medicine

## 2019-03-19 ENCOUNTER — Other Ambulatory Visit (HOSPITAL_COMMUNITY)
Admission: RE | Admit: 2019-03-19 | Discharge: 2019-03-19 | Disposition: A | Discharge status: Home / Self Care | Payer: Medicare Other | Source: Ambulatory Visit | Attending: Gastroenterology | Admitting: Gastroenterology

## 2019-03-19 DIAGNOSIS — Z01812 Encounter for preprocedural laboratory examination: Secondary | ICD-10-CM | POA: Diagnosis not present

## 2019-03-19 DIAGNOSIS — Z20828 Contact with and (suspected) exposure to other viral communicable diseases: Secondary | ICD-10-CM | POA: Diagnosis not present

## 2019-03-19 LAB — SARS CORONAVIRUS 2 (TAT 6-24 HRS): SARS Coronavirus 2: NEGATIVE

## 2019-03-19 NOTE — Progress Notes (Signed)
Pre-op endo call completed.  Patient denies acute cardiac sx at this time.  Reports lov with cards Dr Deno Lunger was 02-14-2019 and reports it was a regular annual visit and PTINR check .  Reports office of Benson Norway, MD advised him to hold his coumadin for procedure. Last dose of asa and coumadin per patient was 03-15-2019

## 2019-03-20 ENCOUNTER — Ambulatory Visit (HOSPITAL_COMMUNITY)
Admission: RE | Admit: 2019-03-20 | Discharge: 2019-03-20 | Disposition: A | Discharge status: Home / Self Care | Payer: Medicare Other | Attending: Gastroenterology | Admitting: Gastroenterology

## 2019-03-20 ENCOUNTER — Ambulatory Visit (HOSPITAL_COMMUNITY): Payer: Medicare Other | Admitting: Physician Assistant

## 2019-03-20 ENCOUNTER — Encounter (HOSPITAL_COMMUNITY)
Admission: RE | Disposition: A | Discharge status: Home / Self Care | Payer: Self-pay | Source: Home / Self Care | Attending: Gastroenterology

## 2019-03-20 ENCOUNTER — Other Ambulatory Visit: Payer: Self-pay

## 2019-03-20 ENCOUNTER — Encounter (HOSPITAL_COMMUNITY): Payer: Self-pay

## 2019-03-20 DIAGNOSIS — Z8673 Personal history of transient ischemic attack (TIA), and cerebral infarction without residual deficits: Secondary | ICD-10-CM | POA: Diagnosis not present

## 2019-03-20 DIAGNOSIS — D120 Benign neoplasm of cecum: Secondary | ICD-10-CM | POA: Insufficient documentation

## 2019-03-20 DIAGNOSIS — I509 Heart failure, unspecified: Secondary | ICD-10-CM | POA: Diagnosis not present

## 2019-03-20 DIAGNOSIS — I2510 Atherosclerotic heart disease of native coronary artery without angina pectoris: Secondary | ICD-10-CM | POA: Diagnosis not present

## 2019-03-20 DIAGNOSIS — Z7901 Long term (current) use of anticoagulants: Secondary | ICD-10-CM | POA: Insufficient documentation

## 2019-03-20 DIAGNOSIS — R194 Change in bowel habit: Secondary | ICD-10-CM | POA: Insufficient documentation

## 2019-03-20 DIAGNOSIS — K5900 Constipation, unspecified: Secondary | ICD-10-CM | POA: Diagnosis not present

## 2019-03-20 DIAGNOSIS — K3189 Other diseases of stomach and duodenum: Secondary | ICD-10-CM | POA: Insufficient documentation

## 2019-03-20 DIAGNOSIS — R1310 Dysphagia, unspecified: Secondary | ICD-10-CM | POA: Diagnosis present

## 2019-03-20 DIAGNOSIS — I4891 Unspecified atrial fibrillation: Secondary | ICD-10-CM | POA: Insufficient documentation

## 2019-03-20 DIAGNOSIS — D123 Benign neoplasm of transverse colon: Secondary | ICD-10-CM | POA: Insufficient documentation

## 2019-03-20 DIAGNOSIS — I252 Old myocardial infarction: Secondary | ICD-10-CM | POA: Diagnosis not present

## 2019-03-20 DIAGNOSIS — Z87891 Personal history of nicotine dependence: Secondary | ICD-10-CM | POA: Insufficient documentation

## 2019-03-20 DIAGNOSIS — D122 Benign neoplasm of ascending colon: Secondary | ICD-10-CM | POA: Insufficient documentation

## 2019-03-20 DIAGNOSIS — D128 Benign neoplasm of rectum: Secondary | ICD-10-CM | POA: Diagnosis not present

## 2019-03-20 DIAGNOSIS — I110 Hypertensive heart disease with heart failure: Secondary | ICD-10-CM | POA: Diagnosis not present

## 2019-03-20 HISTORY — DX: Unspecified atrial fibrillation: I48.91

## 2019-03-20 HISTORY — PX: COLONOSCOPY WITH PROPOFOL: SHX5780

## 2019-03-20 HISTORY — DX: Dizziness and giddiness: R42

## 2019-03-20 HISTORY — PX: BIOPSY: SHX5522

## 2019-03-20 HISTORY — PX: SAVORY DILATION: SHX5439

## 2019-03-20 HISTORY — DX: Other specified disorders of veins: I87.8

## 2019-03-20 HISTORY — PX: ESOPHAGOGASTRODUODENOSCOPY (EGD) WITH PROPOFOL: SHX5813

## 2019-03-20 HISTORY — DX: Atherosclerotic heart disease of native coronary artery without angina pectoris: I25.10

## 2019-03-20 HISTORY — PX: POLYPECTOMY: SHX5525

## 2019-03-20 SURGERY — ESOPHAGOGASTRODUODENOSCOPY (EGD) WITH PROPOFOL
Anesthesia: Monitor Anesthesia Care

## 2019-03-20 MED ORDER — LACTATED RINGERS IV SOLN
INTRAVENOUS | Status: DC
  Administered 2019-03-20: 13:00:00 via INTRAVENOUS
  Administered 2019-03-20: 1000 mL via INTRAVENOUS

## 2019-03-20 MED ORDER — LIDOCAINE 2% (20 MG/ML) 5 ML SYRINGE
INTRAMUSCULAR | Status: DC | PRN
  Administered 2019-03-20: 40 mg via INTRAVENOUS
  Administered 2019-03-20: 60 mg via INTRAVENOUS

## 2019-03-20 MED ORDER — SODIUM CHLORIDE 0.9 % IV SOLN: INTRAVENOUS | Status: DC

## 2019-03-20 MED ORDER — PROPOFOL 10 MG/ML IV BOLUS
INTRAVENOUS | Status: DC | PRN
  Administered 2019-03-20 (×13): 40 mg via INTRAVENOUS

## 2019-03-20 MED ORDER — PROPOFOL 10 MG/ML IV BOLUS
INTRAVENOUS | Status: AC
  Filled 2019-03-20: qty 40

## 2019-03-20 SURGICAL SUPPLY — 24 items

## 2019-03-20 NOTE — Anesthesia Procedure Notes (Signed)
Procedure Name: MAC Date/Time: 03/20/2019 12:26 PM Performed by: Cynda Familia, CRNA Pre-anesthesia Checklist: Patient identified, Emergency Drugs available, Suction available, Patient being monitored and Timeout performed Patient Re-evaluated:Patient Re-evaluated prior to induction Oxygen Delivery Method: Nasal cannula Airway Equipment and Method: Bite block Placement Confirmation: positive ETCO2 and breath sounds checked- equal and bilateral Dental Injury: Teeth and Oropharynx as per pre-operative assessment

## 2019-03-20 NOTE — Op Note (Signed)
[facility] Patient Name: Jason Larson Procedure Date: 03/20/2019 MRN: [id number] Attending MD: Carol Ada , MD Date of Birth: [birth year] CSN: [id number] Age: 84 Admit Type: Outpatient Procedure:                Colonoscopy Indications:              Change in bowel habits Providers:                Carol Ada, MD, Angus Seller, Ladona Ridgel,                            Technician Referring MD:              Medicines:                Propofol per Anesthesia Complications:            No immediate complications. Estimated Blood Loss:     Estimated blood loss: none. Procedure:                Pre-Anesthesia Assessment:                           - Prior to the procedure, a History and Physical                            was performed, and patient medications and                            allergies were reviewed. The patient's tolerance of                            previous anesthesia was also reviewed. The risks                            and benefits of the procedure and the sedation                            options and risks were discussed with the patient.                            All questions were answered, and informed consent                            was obtained. Prior Anticoagulants: The patient has                            taken Coumadin (warfarin), last dose was 5 days                            prior to procedure. ASA Grade Assessment: III - A                            patient with severe systemic disease. After                            reviewing  the risks and benefits, the patient was                            deemed in satisfactory condition to undergo the                            procedure.                           - Sedation was administered by an anesthesia                            professional. Deep sedation was attained.                           After obtaining informed consent, the colonoscope                            was  passed under direct vision. Throughout the                            procedure, the patient's blood pressure, pulse, and                            oxygen saturations were monitored continuously. The                            [id number] Olympus pediatric colonscope                            was introduced through the anus and advanced to the                            the cecum, identified by appendiceal orifice and                            ileocecal valve. The colonoscopy was extremely                            difficult due to significant looping. Successful                            completion of the procedure was aided by changing                            the patient to a supine position, using manual                            pressure and straightening and shortening the scope                            to obtain bowel loop reduction. The patient  tolerated the procedure well. The quality of the                            bowel preparation was good. The ileocecal valve,                            appendiceal orifice, and rectum were photographed. Scope In: 12:48:26 PM Scope Out: 1:21:57 PM Scope Withdrawal Time: 0 hours 14 minutes 31 seconds  Total Procedure Duration: 0 hours 33 minutes 31 seconds  Findings:      Seven sessile polyps were found in the transverse colon, ascending colon       and cecum. The polyps were 2 to 8 mm in size. These polyps were removed       with a cold snare. Resection and retrieval were complete.      There was a large lipoma, in the rectum.      The colonoscopy was extremely difficult to traverse. The patient has a       very floppy colon and a very soft abdomen. It was difficult to apply       effective counter pressure. Impression:               - Seven 2 to 8 mm polyps in the transverse colon,                            in the ascending colon and in the cecum, removed                            with a cold  snare. Resected and retrieved.                           - Large lipoma in the rectum. Moderate Sedation:      Not Applicable - Patient had care per Anesthesia. Recommendation:           - Patient has a contact number available for                            emergencies. The signs and symptoms of potential                            delayed complications were discussed with the                            patient. Return to normal activities tomorrow.                            Written discharge instructions were provided to the                            patient.                           - Resume previous diet.                           - Continue present medications.                           -  Await pathology results.                           - No repeat colonoscopy due to current age (43                            years or older) and his comorbidities.                           - Return to GI clinic in 4 weeks.                           - Restart coumadin. Procedure Code(s):        --- Professional ---                           [phone], Colonoscopy, flexible; with removal of                            tumor(s), polyp(s), or other lesion(s) by snare                            technique Diagnosis Code(s):        --- Professional ---                           K63.5, Polyp of colon                           D17.5, Benign lipomatous neoplasm of                            intra-abdominal organs                           R19.4, Change in bowel habit CPT copyright 2019 American Medical Association. All rights reserved. The codes documented in this report are preliminary and upon coder review may  be revised to meet current compliance requirements. Carol Ada, MD Carol Ada, MD 03/20/2019 1:32:29 PM This report has been signed electronically. Number of Addenda: 0

## 2019-03-20 NOTE — H&P (Signed)
  Jason Larson HPI: Acutely he started to have constipation that started 4-6 weeks ago. He started on over-the-counter laxatives, which were helpful. Dr. Terrence Dupont started him on Miralax QHS and it was helpful, but it does cause him to have diarrhea. The patient denies taking any new medications or taking any herbal supplements. There are no reports of hematochezia, melena, chest pain, SOB, or MI. He also feels that his dysphagia is worsening. An EGD was performed two years ago with dilation and he did improve with the dilation. The esophageal biopsies were normal.   Past Medical History:  Diagnosis Date  . A-fib ([place])    03-19-2019 denies palpiations   . CAD (coronary artery disease)    total of 5 stents , 2001 and 2002  . CHF (congestive heart failure) ([place])   . Chronic venous stasis    hx of , 03-19-2019 reports no pain in lower legs and legs are chronically dark brown/red in color   . Gout   . Heart attack ([place])    1992   . High cholesterol   . Hypertension   . TIA (transient ischemic attack) 2009   at this time was restarted on coumadin , denies reoccurrence of TIA sx since that time   . Vertigo    ive got a manuever i do twice a day and that has helped .    Past Surgical History:  Procedure Laterality Date  . BACK SURGERY  2014   middle of my back  . CARDIAC CATHETERIZATION N/A 06/22/2016   Procedure: Left Heart Cath and Coronary Angiography;  Surgeon: Charolette Forward, MD;  Location: [facility];  Service: Cardiovascular;  Laterality: N/A;  . CARDIAC CATHETERIZATION    . ESOPHAGOGASTRODUODENOSCOPY (EGD) WITH PROPOFOL N/A 12/10/2016   Procedure: ESOPHAGOGASTRODUODENOSCOPY (EGD) WITH PROPOFOL;  Surgeon: Carol Ada, MD;  Location: [facility];  Service: Endoscopy;  Laterality: N/A;  . varicose veins       History reviewed. No pertinent family history.  Social History:  reports that he quit smoking about 30 years ago. He has never used smokeless tobacco. He reports  that he does not drink alcohol or use drugs.  Allergies: No Known Allergies  Medications:  Scheduled:  Continuous: . sodium chloride    . lactated ringers 1,000 mL (03/20/19 1200)    Results for orders placed or performed during the hospital encounter of 03/19/19 (from the past 24 hour(s))  SARS CORONAVIRUS 2 (TAT 6-12 HRS) Nasal Swab Aptima Multi Swab     Status: None   Collection Time: 03/19/19 12:54 PM   Specimen: Aptima Multi Swab; Nasal Swab  Result Value Ref Range   SARS Coronavirus 2 NEGATIVE NEGATIVE     No results found.  ROS:  As stated above in the HPI otherwise negative.  Blood pressure (!) 151/58, pulse (!) 55, temperature 98 F (36.7 C), temperature source Oral, resp. rate (!) 21, height 5\' 7"  (1.702 m), weight 93.9 kg, SpO2 98 %.    PE: Gen: NAD, Alert and Oriented HEENT:  [place]/AT, EOMI Neck: Supple, no LAD Lungs: CTA Bilaterally CV: RRR without M/G/R ABM: Soft, NTND, +BS Ext: No C/C/E  Assessment/Plan: 1) Change in bowel habits. 2) Dysphagia.  An EGD/colonoscopy will be performed.  Jolina Symonds D 03/20/2019, 12:21 PM

## 2019-03-20 NOTE — Transfer of Care (Signed)
Immediate Anesthesia Transfer of Care Note  Patient: Jason Larson  Procedure(s) Performed: ESOPHAGOGASTRODUODENOSCOPY (EGD) WITH PROPOFOL (N/A ) COLONOSCOPY WITH PROPOFOL (N/A )  Patient Location: PACU and Endoscopy Unit  Anesthesia Type:MAC  Level of Consciousness: awake and alert   Airway & Oxygen Therapy: Patient Spontanous Breathing and Patient connected to nasal cannula oxygen  Post-op Assessment: Report given to RN and Post -op Vital signs reviewed and stable  Post vital signs: Reviewed and stable  Last Vitals:  Vitals Value Taken Time  BP 143/64 03/20/19 1331  Temp 36.7 C 03/20/19 1331  Pulse    Resp 17 03/20/19 1333  SpO2    Vitals shown include unvalidated device data.  Last Pain:  Vitals:   03/20/19 1331  TempSrc: Temporal  PainSc: 0-No pain         Complications: No apparent anesthesia complications

## 2019-03-20 NOTE — Op Note (Signed)
[facility] Patient Name: Jason Larson Procedure Date: 03/20/2019 MRN: [id number] Attending MD: Carol Ada , MD Date of Birth: 15-Nov-1934 CSN: [id number] Age: 84 Admit Type: Outpatient Procedure:                Upper GI endoscopy Indications:              Dysphagia Providers:                Carol Ada, Yvone Neu, RN, Truddie Coco, RN Referring MD:              Medicines:                Propofol per Anesthesia Complications:            No immediate complications. Estimated Blood Loss:     Estimated blood loss was minimal. Procedure:                Pre-Anesthesia Assessment:                           - Prior to the procedure, a History and Physical                            was performed, and patient medications and                            allergies were reviewed. The patient's tolerance of                            previous anesthesia was also reviewed. The risks                            and benefits of the procedure and the sedation                            options and risks were discussed with the patient.                            All questions were answered, and informed consent                            was obtained. Prior Anticoagulants: The patient has                            taken no previous anticoagulant or antiplatelet                            agents. ASA Grade Assessment: III - A patient with                            severe systemic disease. After reviewing the risks                            and benefits, the patient was deemed in  satisfactory condition to undergo the procedure.                           - Sedation was administered by an anesthesia                            professional. Deep sedation was attained.                           After obtaining informed consent, the endoscope was                            passed under direct vision. Throughout the                            procedure, the  patient's blood pressure, pulse, and                            oxygen saturations were monitored continuously. The                            [id number] Olympus gastroscope was                            introduced through the mouth, and advanced to the                            second part of duodenum. The upper GI endoscopy was                            accomplished without difficulty. The patient                            tolerated the procedure well. Scope In: Scope Out: Findings:      No endoscopic abnormality was evident in the esophagus to explain the       patient's complaint of dysphagia. It was decided, however, to proceed       with dilation of the entire esophagus. A guidewire was placed and the       scope was withdrawn. Dilation was performed with a Savary dilator with       no resistance at 18 mm. The dilation site was examined following       endoscope reinsertion and showed no change.      Multiple dispersed, small non-bleeding erosions were found in the       gastric antrum. There were no stigmata of recent bleeding. Biopsies were       taken with a cold forceps for histology.      The examined duodenum was normal. Impression:               - No endoscopic esophageal abnormality to explain                            patient's dysphagia. Esophagus dilated. Dilated.                           -  Non-bleeding erosive gastropathy. Biopsied.                           - Normal examined duodenum. Moderate Sedation:      Not Applicable - Patient had care per Anesthesia. Recommendation:           - Patient has a contact number available for                            emergencies. The signs and symptoms of potential                            delayed complications were discussed with the                            patient. Return to normal activities tomorrow.                            Written discharge instructions were provided to the                             patient.                           - Resume regular diet.                           - Continue present medications.                           - Await pathology results.                           - Return to GI clinic in 4 weeks. Procedure Code(s):        --- Professional ---                           [phone], Esophagogastroduodenoscopy, flexible,                            transoral; with insertion of guide wire followed by                            passage of dilator(s) through esophagus over guide                            wire                           [id number], 59, Esophagogastroduodenoscopy, flexible,                            transoral; with biopsy, single or multiple Diagnosis Code(s):        --- Professional ---                           R13.10, Dysphagia, unspecified  K31.89, Other diseases of stomach and duodenum CPT copyright 2019 American Medical Association. All rights reserved. The codes documented in this report are preliminary and upon coder review may  be revised to meet current compliance requirements. Carol Ada, MD Carol Ada, MD 03/20/2019 1:35:05 PM This report has been signed electronically. Number of Addenda: 0

## 2019-03-20 NOTE — Anesthesia Preprocedure Evaluation (Addendum)
Anesthesia Evaluation  Patient identified by MRN, date of birth, ID band Patient awake    Reviewed: Allergy & Precautions, NPO status , Patient's Chart, lab work & pertinent test results  Airway Mallampati: II  TM Distance: >3 FB Neck ROM: Full    Dental no notable dental hx. (+) Upper Dentures   Pulmonary former smoker,    Pulmonary exam normal breath sounds clear to auscultation       Cardiovascular hypertension, + CAD and +CHF  Normal cardiovascular exam+ dysrhythmias Atrial Fibrillation  Rhythm:Regular Rate:Normal  Ef 45-50%   Neuro/Psych TIAnegative psych ROS   GI/Hepatic negative GI ROS, Neg liver ROS,   Endo/Other  negative endocrine ROS  Renal/GU negative Renal ROS     Musculoskeletal   Abdominal   Peds  Hematology negative hematology ROS (+)   Anesthesia Other Findings   Reproductive/Obstetrics                             Anesthesia Physical Anesthesia Plan  ASA: III  Anesthesia Plan: MAC   Post-op Pain Management:    Induction: Intravenous  PONV Risk Score and Plan: Treatment may vary due to age or medical condition  Airway Management Planned: Natural Airway and Nasal Cannula  Additional Equipment:   Intra-op Plan:   Post-operative Plan:   Informed Consent: I have reviewed the patients History and Physical, chart, labs and discussed the procedure including the risks, benefits and alternatives for the proposed anesthesia with the patient or authorized representative who has indicated his/her understanding and acceptance.     Dental advisory given  Plan Discussed with: CRNA  Anesthesia Plan Comments:         Anesthesia Quick Evaluation

## 2019-03-20 NOTE — Anesthesia Postprocedure Evaluation (Signed)
Anesthesia Post Note  Patient: Jason Larson  Procedure(s) Performed: ESOPHAGOGASTRODUODENOSCOPY (EGD) WITH PROPOFOL (N/A ) COLONOSCOPY WITH PROPOFOL (N/A )     Patient location during evaluation: Endoscopy Anesthesia Type: MAC Level of consciousness: awake and alert Pain management: pain level controlled Vital Signs Assessment: post-procedure vital signs reviewed and stable Respiratory status: spontaneous breathing, nonlabored ventilation, respiratory function stable and patient connected to nasal cannula oxygen Cardiovascular status: blood pressure returned to baseline and stable Postop Assessment: no apparent nausea or vomiting Anesthetic complications: no    Last Vitals:  Vitals:   03/20/19 1331 03/20/19 1336  BP: (!) 143/64 (!) 143/64  Pulse:  63  Resp: 18 15  Temp: 36.7 C (!) 36.2 C  SpO2: 96% 95%    Last Pain:  Vitals:   03/20/19 1336  TempSrc: Temporal  PainSc: 0-No pain                 Barnet Glasgow

## 2019-03-20 NOTE — Discharge Instructions (Signed)

## 2019-03-22 ENCOUNTER — Encounter (HOSPITAL_COMMUNITY): Payer: Self-pay | Admitting: Gastroenterology

## 2019-09-15 ENCOUNTER — Ambulatory Visit: Payer: Medicare Other | Attending: Internal Medicine

## 2019-09-15 DIAGNOSIS — Z23 Encounter for immunization: Secondary | ICD-10-CM | POA: Insufficient documentation

## 2019-09-15 NOTE — Progress Notes (Signed)
   Covid-19 Vaccination Clinic  Name:  Jason Larson    MRN: [id number] DOB: [birth year]  09/15/2019  Jason Larson was observed post Covid-19 immunization for 15 minutes without incidence. He was provided with Vaccine Information Sheet and instruction to access the V-Safe system.   Jason Larson was instructed to call 911 with any severe reactions post vaccine: Marland Kitchen Difficulty breathing  . Swelling of your face and throat  . A fast heartbeat  . A bad rash all over your body  . Dizziness and weakness    Immunizations Administered    Name Date Dose VIS Date Route   Pfizer COVID-19 Vaccine 09/15/2019  2:10 PM 0.3 mL 07/06/2019 Intramuscular   Manufacturer: [place]   Lot: [id number]   [place]: [id number]

## 2019-10-09 ENCOUNTER — Ambulatory Visit: Payer: Medicare Other | Attending: Internal Medicine

## 2019-10-09 DIAGNOSIS — Z23 Encounter for immunization: Secondary | ICD-10-CM

## 2019-10-09 NOTE — Progress Notes (Signed)
   Covid-19 Vaccination Clinic  Name:  Jason Larson    MRN: [id number] DOB: [birth year]  10/09/2019  [name] was observed post Covid-19 immunization for 15 minutes without incident. He was provided with Vaccine Information Sheet and instruction to access the V-Safe system.   [name] was instructed to call 911 with any severe reactions post vaccine: Marland Kitchen Difficulty breathing  . Swelling of face and throat  . A fast heartbeat  . A bad rash all over body  . Dizziness and weakness   Immunizations Administered    Name Date Dose VIS Date Route   Pfizer COVID-19 Vaccine 10/09/2019  1:49 PM 0.3 mL 07/06/2019 Intramuscular   Manufacturer: [place]   Lot: [id number]   [place]: [id number]

## 2020-02-18 ENCOUNTER — Encounter (HOSPITAL_COMMUNITY): Payer: Self-pay | Admitting: Pharmacy Technician

## 2020-02-18 ENCOUNTER — Emergency Department (HOSPITAL_COMMUNITY)
Admission: EM | Admit: 2020-02-18 | Discharge: 2020-02-18 | Disposition: A | Discharge status: Home / Self Care | Payer: Medicare Other | Attending: Emergency Medicine | Admitting: Emergency Medicine

## 2020-02-18 DIAGNOSIS — Z79899 Other long term (current) drug therapy: Secondary | ICD-10-CM | POA: Insufficient documentation

## 2020-02-18 DIAGNOSIS — H60501 Unspecified acute noninfective otitis externa, right ear: Secondary | ICD-10-CM | POA: Diagnosis not present

## 2020-02-18 DIAGNOSIS — Z7982 Long term (current) use of aspirin: Secondary | ICD-10-CM | POA: Insufficient documentation

## 2020-02-18 DIAGNOSIS — Z87891 Personal history of nicotine dependence: Secondary | ICD-10-CM | POA: Diagnosis not present

## 2020-02-18 DIAGNOSIS — I509 Heart failure, unspecified: Secondary | ICD-10-CM | POA: Diagnosis not present

## 2020-02-18 DIAGNOSIS — I110 Hypertensive heart disease with heart failure: Secondary | ICD-10-CM | POA: Insufficient documentation

## 2020-02-18 DIAGNOSIS — H9210 Otorrhea, unspecified ear: Secondary | ICD-10-CM | POA: Diagnosis present

## 2020-02-18 MED ORDER — CIPROFLOXACIN-DEXAMETHASONE 0.3-0.1 % OT SUSP: 4.0000 [drp] | Freq: Two times a day (BID) | OTIC | 0 refills | Status: AC

## 2020-02-18 MED ORDER — CIPROFLOXACIN-DEXAMETHASONE 0.3-0.1 % OT SUSP
4.0000 [drp] | Freq: Once | OTIC | Status: AC
  Administered 2020-02-18: 4 [drp] via OTIC
  Filled 2020-02-18: qty 7.5

## 2020-02-18 MED ORDER — AMOXICILLIN 500 MG PO CAPS: 500.0000 mg | ORAL_CAPSULE | Freq: Two times a day (BID) | ORAL | 0 refills | Status: AC

## 2020-02-18 NOTE — ED Notes (Addendum)
Pt moved to 40 where there is an otoscope present; kidney basin, curette, peroxide, flush at bedside

## 2020-02-18 NOTE — Discharge Instructions (Signed)
Follow-up in 3 days for reevaluation.  This may be done in primary care office, urgent care.  Return to emergency department for any worsening symptoms.

## 2020-02-18 NOTE — ED Triage Notes (Signed)
Pt arrives pov with reports of right sided ear "blockage" X10 days. Denies pain.

## 2020-02-18 NOTE — ED Provider Notes (Signed)
[facility] EMERGENCY DEPARTMENT Provider Note   CSN: [id number] Arrival date & time: 02/18/20  1121    History Ear drainage  Jason Larson is a 85 y.o. male with history significant for A. fib, CAD, CHF, hypertension, TIA who presents for evaluation of drainage from his right ear.  States initially he felt his ear was "blocked."  X10 days.  States 3 days ago he noticed yellow drainage.  He was seen by Dr. Terrence Dupont his cardiologist and he states that he was told that there is "nothing wrong with my ear."  Has some mild pain.  No recent swimming activities.  Denies fever, chills, nausea, vomiting, neck pain, facial swelling, bleeding from his ear, trauma.  Denies additional aggravating or relieving factors.  Has been putting Neosporin in in.   History obtained from patient and past medical records. No interpreter used.  HPI     Past Medical History:  Diagnosis Date  . A-fib ([place])    03-19-2019 denies palpiations   . CAD (coronary artery disease)    total of 5 stents , 2001 and 2002  . CHF (congestive heart failure) ([place])   . Chronic venous stasis    hx of , 03-19-2019 reports no pain in lower legs and legs are chronically dark brown/red in color   . Gout   . Heart attack ([place])    1992   . High cholesterol   . Hypertension   . TIA (transient ischemic attack) 2009   at this time was restarted on coumadin , denies reoccurrence of TIA sx since that time   . Vertigo    ive got a manuever i do twice a day and that has helped .    There are no problems to display for this patient.   Past Surgical History:  Procedure Laterality Date  . BACK SURGERY  2014   middle of my back  . BIOPSY  03/20/2019   Procedure: BIOPSY;  Surgeon: Carol Ada, MD;  Location: [facility];  Service: Endoscopy;;  . CARDIAC CATHETERIZATION N/A 06/22/2016   Procedure: Left Heart Cath and Coronary Angiography;  Surgeon: Charolette Forward, MD;  Location: [facility];  Service:  Cardiovascular;  Laterality: N/A;  . CARDIAC CATHETERIZATION    . COLONOSCOPY WITH PROPOFOL N/A 03/20/2019   Procedure: COLONOSCOPY WITH PROPOFOL;  Surgeon: Carol Ada, MD;  [facility]: [facility];  Service: Endoscopy;  Laterality: N/A;  . ESOPHAGOGASTRODUODENOSCOPY (EGD) WITH PROPOFOL N/A 12/10/2016   Procedure: ESOPHAGOGASTRODUODENOSCOPY (EGD) WITH PROPOFOL;  Surgeon: Carol Ada, MD;  [facility]: [facility];  Service: Endoscopy;  Laterality: N/A;  . ESOPHAGOGASTRODUODENOSCOPY (EGD) WITH PROPOFOL N/A 03/20/2019   Procedure: ESOPHAGOGASTRODUODENOSCOPY (EGD) WITH PROPOFOL;  Surgeon: Carol Ada, MD;  [facility];  Service: Endoscopy;  Laterality: N/A;  . POLYPECTOMY  03/20/2019   Procedure: POLYPECTOMY;  Surgeon: Carol Ada, MD;  Location: [facility];  Service: Endoscopy;;  . Azzie Almas DILATION N/A 03/20/2019   Procedure: Azzie Almas DILATION;  Surgeon: Carol Ada, MD;  Location: [facility];  Service: Endoscopy;  Laterality: N/A;  . varicose veins          No family history on file.  Social History   Tobacco Use  . Smoking status: Former Smoker    Quit date: 1990    Years since quitting: 31.5  . Smokeless tobacco: Never Used  Vaping Use  . Vaping Use: Never used  Substance Use Topics  . Alcohol use: No  . Drug use: No    Home Medications  Prior to Admission medications   Medication Sig Start Date End Date Taking? Authorizing Provider  acetaminophen (TYLENOL) 650 MG CR tablet Take 650 mg by mouth 2 (two) times daily.    [provider]  allopurinol (ZYLOPRIM) 100 MG tablet Take 100 mg by mouth daily.    [provider]  amLODipine (NORVASC) 10 MG tablet Take 10 mg by mouth daily at 3 pm.     [provider]  amoxicillin (AMOXIL) 500 MG capsule Take 1 capsule (500 mg total) by mouth 2 (two) times daily for 5 days. 02/18/20 02/23/20  Aubra Pappalardo A, PA-C  aspirin 81 MG tablet Take 81 mg by mouth at bedtime.     [provider]    B Complex CAPS Take 1 capsule by mouth daily.    [provider]  bumetanide (BUMEX) 1 MG tablet Take 1 mg by mouth daily.    [provider]  Carboxymethylcellul-Glycerin (LUBRICATING EYE DROPS OP) Place 1 drop into both eyes daily as needed (irritation).    [provider]  carvedilol (COREG) 6.25 MG tablet Take 6.25 mg by mouth at bedtime.     [provider]  ciprofloxacin-dexamethasone (CIPRODEX) OTIC suspension Place 4 drops into the right ear 2 (two) times daily for 5 days. 02/18/20 02/23/20  Bionca Mckey A, PA-C  Coenzyme Q10 200 MG capsule Take 200 mg by mouth daily.    [provider]  diphenhydramine-acetaminophen (TYLENOL PM) 25-500 MG TABS tablet Take 1 tablet by mouth at bedtime.    [provider]  gemfibrozil (LOPID) 600 MG tablet Take 600 mg by mouth daily.    [provider]  Javier Docker Oil 500 MG CAPS Take 500 mg by mouth daily.    [provider]  losartan (COZAAR) 100 MG tablet Take 100 mg by mouth daily at 3 pm.     [provider]  magnesium oxide (MAG-OX) 400 MG tablet Take 400 mg by mouth daily.    [provider]  meclizine (ANTIVERT) 25 MG tablet Take 12.5 mg by mouth 3 (three) times daily.     [provider]  pravastatin (PRAVACHOL) 40 MG tablet Take 40 mg by mouth daily.     [provider]  warfarin (COUMADIN) 3 MG tablet Take 3 mg by mouth See admin instructions. Take 3 mg at 4 pm on Mon, Tue, Thurs, Fri, and Sat. Skip doses on Sun and Wed    [provider]    Allergies    Patient has no known allergies.  Review of Systems   Review of Systems  Constitutional: Negative.   HENT: Positive for ear discharge and ear pain. Negative for congestion, drooling, facial swelling, hearing loss, mouth sores, nosebleeds, postnasal drip, rhinorrhea, sinus pressure, sinus pain, sore throat, tinnitus and voice change.   Eyes: Negative.   Respiratory: Negative for  cough and shortness of breath.   Cardiovascular: Negative for chest pain and leg swelling.  Gastrointestinal: Negative for nausea and vomiting.  Musculoskeletal: Negative for neck pain and neck stiffness.  Neurological: Negative.   All other systems reviewed and are negative.   Physical Exam Updated Vital Signs BP (!) 141/74 (BP Location: Right Arm)   Pulse 54   Temp 98 F (36.7 C) (Oral)   Resp 18   SpO2 100%   Physical Exam Vitals and nursing note reviewed.  Constitutional:      General: He is not in acute distress.    Appearance: He is well-developed. He  is not ill-appearing, toxic-appearing or diaphoretic.  HENT:     Head: Normocephalic and atraumatic.     Jaw: There is normal jaw occlusion.     Comments: No tenderness over mastoids bilaterally. No erythema over mastoids    Right Ear: Drainage, swelling and tenderness present. A middle ear effusion is present. No mastoid tenderness. No PE tube. Tympanic membrane is erythematous and bulging.     Left Ear: Tympanic membrane, ear canal and external ear normal. There is no impacted cerumen.     Ears:     Comments: Mild tenderness to retraction of pain on the right ear.  No tenderness to palpation of tragus    Nose: Nose normal.     Mouth/Throat:     Mouth: Mucous membranes are moist.  Eyes:     Pupils: Pupils are equal, round, and reactive to light.  Neck:     Trachea: Trachea and phonation normal.  Cardiovascular:     Rate and Rhythm: Normal rate and regular rhythm.  Pulmonary:     Effort: Pulmonary effort is normal. No respiratory distress.  Abdominal:     General: There is no distension.     Palpations: Abdomen is soft.  Musculoskeletal:        General: Normal range of motion.     Cervical back: Full passive range of motion without pain, normal range of motion and neck supple.  Skin:    General: Skin is warm and dry.     Capillary Refill: Capillary refill takes less than 2 seconds.     Comments: No edema, erythema  or warmth  Neurological:     Mental Status: He is alert.     Comments: Cranial nerves II through XII grossly intact.  Ambulatory without difficulty.    ED Results / Procedures / Treatments   Labs (all labs ordered are listed, but only abnormal results are displayed) Labs Reviewed - No data to display  EKG None  Radiology No results found.  Procedures Procedures (including critical care time)  Medications Ordered in ED Medications  ciprofloxacin-dexamethasone (CIPRODEX) 0.3-0.1 % OTIC (EAR) suspension 4 drop (4 drops Right EAR Given 02/18/20 1431)   ED Course  I have reviewed the triage vital signs and the nursing notes.  Pertinent labs & imaging results that were available during my care of the patient were reviewed by me and considered in my medical decision making (see chart for details).  85 year old presents for evaluation of drainage and pain to his right ear.  He is afebrile, nonseptic, not ill-appearing.  No neck stiffness or neck rigidity.  No meningismus.  No evidence of malignant otitis externa, cellulitis, meningitis, mastoiditis.  He does have edematous erythematous right ear canal with yellow drainage.  Some mild tenderness to retraction of pinna.  Looks like he does have some bulging of his TM.  Appears overall well.  Stable vital signs.  Ear wick placed with abx drops here in ED.  Will start on p.o. antibiotics as well.  He will follow-up outpatient for reevaluation.  He will return for any worsening symptoms.  The patient has been appropriately medically screened and/or stabilized in the ED. I have low suspicion for any other emergent medical condition which would require further screening, evaluation or treatment in the ED or require inpatient management.  Patient is hemodynamically stable and in no acute distress.  Patient able to ambulate in department prior to ED.  Evaluation does not show acute pathology that would require ongoing or additional  emergent  interventions while in the emergency department or further inpatient treatment.  I have discussed the diagnosis with the patient and answered all questions.  Pain is been managed while in the emergency department and patient has no further complaints prior to discharge.  Patient is comfortable with plan discussed in room and is stable for discharge at this time.  I have discussed strict return precautions for returning to the emergency department.  Patient was encouraged to follow-up with PCP/specialist refer to at discharge.  Patient seen and evaluated by attending, Dr. Eulis Foster who agrees with the treatment, plan and disposition.    MDM Rules/Calculators/A&P                           Final Clinical Impression(s) / ED Diagnoses Final diagnoses:  Acute otitis externa of right ear, unspecified type    Rx / DC Orders ED Discharge Orders         Ordered    amoxicillin (AMOXIL) 500 MG capsule  2 times daily     Discontinue  Reprint     02/18/20 1501    ciprofloxacin-dexamethasone (CIPRODEX) OTIC suspension  2 times daily     Discontinue  Reprint     02/18/20 1501           Cung Masterson A, PA-C 02/18/20 1502    Daleen Bo, MD 02/18/20 1756

## 2020-02-18 NOTE — ED Provider Notes (Signed)
  Face-to-face evaluation   History: He is here for evaluation of decreased hearing and ear pain present for about a week and worsening.  No known trauma.  No prior similar symptoms  Physical exam: Alert elderly male who appears comfortable.  External auditory canal is swollen, with redness, and tenderness to touch.  Moderate amount of exudative material in the external canal, with abnormal appearing TM, thickened and opaque at the base.  Medical screening examination/treatment/procedure(s) were conducted as a shared visit with non-physician practitioner(s) and myself.  I personally evaluated the patient during the encounter    Daleen Bo, MD 02/18/20 1757

## 2020-02-18 NOTE — ED Notes (Signed)
ED Provider at bedside. 

## 2020-02-21 ENCOUNTER — Encounter (HOSPITAL_COMMUNITY): Payer: Self-pay

## 2020-02-21 ENCOUNTER — Other Ambulatory Visit: Payer: Self-pay

## 2020-02-21 ENCOUNTER — Ambulatory Visit (HOSPITAL_COMMUNITY)
Admission: EM | Admit: 2020-02-21 | Discharge: 2020-02-21 | Disposition: A | Discharge status: Home / Self Care | Payer: Medicare Other

## 2020-02-21 DIAGNOSIS — H60331 Swimmer's ear, right ear: Secondary | ICD-10-CM | POA: Diagnosis present

## 2020-02-21 NOTE — Discharge Instructions (Addendum)
Continue and complete antibiotic and ear drops

## 2020-02-21 NOTE — ED Provider Notes (Signed)
[name]    CSN: [id number] Arrival date & time: 02/21/20  1300      History   Chief Complaint Chief Complaint  Patient presents with  . Follow-up    HPI Jason Larson is a 85 y.o. male.   85 yr old male pt presents to UC for recheck of right ear wick, states ENT appt 8/10. Taking amoxicillin and Cortisporin ear drops without issues, pain has improved.   The history is provided by the patient. No language interpreter was used.    Past Medical History:  Diagnosis Date  . A-fib ([place])    03-19-2019 denies palpiations   . CAD (coronary artery disease)    total of 5 stents , 2001 and 2002  . CHF (congestive heart failure) ([place])   . Chronic venous stasis    hx of , 03-19-2019 reports no pain in lower legs and legs are chronically dark brown/red in color   . Gout   . Heart attack ([place])    1992   . High cholesterol   . Hypertension   . TIA (transient ischemic attack) 2009   at this time was restarted on coumadin , denies reoccurrence of TIA sx since that time   . Vertigo    ive got a manuever i do twice a day and that has helped .    Patient Active Problem List   Diagnosis Date Noted  . Acute swimmer's ear of right side 02/21/2020    Past Surgical History:  Procedure Laterality Date  . BACK SURGERY  2014   middle of my back  . BIOPSY  03/20/2019   Procedure: BIOPSY;  Surgeon: Carol Ada, MD;  Location: [facility];  Service: Endoscopy;;  . CARDIAC CATHETERIZATION N/A 06/22/2016   Procedure: Left Heart Cath and Coronary Angiography;  Surgeon: Charolette Forward, MD;  Location: [facility];  Service: Cardiovascular;  Laterality: N/A;  . CARDIAC CATHETERIZATION    . COLONOSCOPY WITH PROPOFOL N/A 03/20/2019   Procedure: COLONOSCOPY WITH PROPOFOL;  Surgeon: Carol Ada, MD;  Location: [facility];  Service: Endoscopy;  Laterality: N/A;  . ESOPHAGOGASTRODUODENOSCOPY (EGD) WITH PROPOFOL N/A 12/10/2016   Procedure: ESOPHAGOGASTRODUODENOSCOPY (EGD) WITH  PROPOFOL;  Surgeon: Carol Ada, MD;  Location: [facility];  Service: Endoscopy;  Laterality: N/A;  . ESOPHAGOGASTRODUODENOSCOPY (EGD) WITH PROPOFOL N/A 03/20/2019   Procedure: ESOPHAGOGASTRODUODENOSCOPY (EGD) WITH PROPOFOL;  Surgeon: Carol Ada, MD;  [facility]: [facility];  Service: Endoscopy;  Laterality: N/A;  . POLYPECTOMY  03/20/2019   Procedure: POLYPECTOMY;  Surgeon: Carol Ada, MD;  [facility]: [facility];  Service: Endoscopy;;  . Azzie Almas DILATION N/A 03/20/2019   Procedure: Azzie Almas DILATION;  Surgeon: Carol Ada, MD;  Location: [facility];  Service: Endoscopy;  Laterality: N/A;  . varicose veins          Home Medications    Prior to Admission medications   Medication Sig Start Date End Date Taking? Authorizing Provider  acetaminophen (TYLENOL) 650 MG CR tablet Take 650 mg by mouth 2 (two) times daily.    [provider]  allopurinol (ZYLOPRIM) 100 MG tablet Take 100 mg by mouth daily.    [provider]  amLODipine (NORVASC) 10 MG tablet Take 10 mg by mouth daily at 3 pm.     [provider]  amoxicillin (AMOXIL) 500 MG capsule Take 1 capsule (500 mg total) by mouth 2 (two) times daily for 5 days. 02/18/20 02/23/20  Henderly, Britni A, PA-C  aspirin 81 MG tablet Take 81 mg  by mouth at bedtime.     [provider]  B Complex CAPS Take 1 capsule by mouth daily.    [provider]  bumetanide (BUMEX) 1 MG tablet Take 1 mg by mouth daily.    [provider]  Carboxymethylcellul-Glycerin (LUBRICATING EYE DROPS OP) Place 1 drop into both eyes daily as needed (irritation).    [provider]  carvedilol (COREG) 6.25 MG tablet Take 6.25 mg by mouth at bedtime.     [provider]  ciprofloxacin-dexamethasone (CIPRODEX) OTIC suspension Place 4 drops into the right ear 2 (two) times daily for 5 days. 02/18/20 02/23/20  Henderly, Britni A, PA-C  Coenzyme Q10 200 MG capsule Take 200 mg by mouth daily.    [provider]  diphenhydramine-acetaminophen (TYLENOL PM) 25-500 MG TABS tablet Take 1 tablet by mouth at bedtime.    [provider]  gemfibrozil (LOPID) 600 MG tablet Take 600 mg by mouth daily.    [provider]  Javier Docker Oil 500 MG CAPS Take 500 mg by mouth daily.    [provider]  losartan (COZAAR) 100 MG tablet Take 100 mg by mouth daily at 3 pm.     [provider]  magnesium oxide (MAG-OX) 400 MG tablet Take 400 mg by mouth daily.    [provider]  meclizine (ANTIVERT) 25 MG tablet Take 12.5 mg by mouth 3 (three) times daily.     [provider]  pravastatin (PRAVACHOL) 40 MG tablet Take 40 mg by mouth daily.     [provider]  warfarin (COUMADIN) 3 MG tablet Take 3 mg by mouth See admin instructions. Take 3 mg at 4 pm on Mon, Tue, Thurs, Fri, and Sat. Skip doses on Sun and Wed    [provider]    Family History History reviewed. No pertinent family history.  Social History Social History   Tobacco Use  . Smoking status: Former Smoker    Quit date: 1990    Years since quitting: 31.5  . Smokeless tobacco: Never Used  Vaping Use  . Vaping Use: Never used  Substance Use Topics  . Alcohol use: No  . Drug use: No     Allergies   Patient has no known allergies.   Review of Systems Review of Systems  All other systems reviewed and are negative.    Physical Exam Triage Vital Signs ED Triage Vitals  Enc Vitals Group     BP 02/21/20 1417 (!) 138/70     Pulse Rate 02/21/20 1417 64     Resp 02/21/20 1417 16     Temp 02/21/20 1417 98.3 F (36.8 C)     Temp Source 02/21/20 1417 Oral     SpO2 02/21/20 1417 98 %     Weight --      Height --      Head Circumference --      Peak Flow --      Pain Score 02/21/20 1415 0     Pain Loc --      Pain Edu? --      Excl. in [place]? --    No data found.  Updated Vital Signs BP (!) 138/70 (BP Location: Right Arm)   Pulse 64   Temp 98.3 F (36.8 C)  (Oral)   Resp 16   SpO2 98%    Physical Exam Vitals and nursing note reviewed.  Constitutional:      Appearance: Normal appearance. He is well-developed.  HENT:     Head: Normocephalic.     Right Ear: External ear normal. Drainage present. No swelling or tenderness.     Ears:     Comments: No ear wick noted in right ear, exudate in right ear Neurological:     General: No focal deficit present.     Mental Status: He is alert.     GCS: GCS eye subscore is 4. GCS verbal subscore is 5. GCS motor subscore is 6.  Psychiatric:        Attention and Perception: Attention normal.        Mood and Affect: Mood normal.        Speech: Speech normal.        Behavior: Behavior is cooperative.      UC Treatments / Results  Labs (all labs ordered are listed, but only abnormal results are displayed) Labs Reviewed - No data to display  EKG   Radiology No results found.  Procedures Procedures (including critical care time)  Medications Ordered in UC Medications - No data to display  Initial Impression / Assessment and Plan / UC Course  I have reviewed the triage vital signs and the nursing notes.  Pertinent labs & imaging results that were available during my care of the patient were reviewed by me and considered in my medical decision making (see chart for details).     Keep appt with ENT as scheduled.  Final Clinical Impressions(s) / UC Diagnoses   Final diagnoses:  Acute swimmer's ear of right side     Discharge Instructions     Continue and complete antibiotic and ear drops    ED Prescriptions    None     PDMP not reviewed this encounter.   Tori Milks, NP 17/79/39 [id number]

## 2020-02-21 NOTE — ED Triage Notes (Signed)
Pt presents to follow up and removal or ear wick in the right ear. States hearing decreased. Denies ear pain, ear drainage, chills, fever.

## 2020-03-04 ENCOUNTER — Other Ambulatory Visit: Payer: Self-pay

## 2020-03-04 ENCOUNTER — Encounter (INDEPENDENT_AMBULATORY_CARE_PROVIDER_SITE_OTHER): Payer: Self-pay | Admitting: Otolaryngology

## 2020-03-04 ENCOUNTER — Ambulatory Visit (INDEPENDENT_AMBULATORY_CARE_PROVIDER_SITE_OTHER): Payer: Medicare Other | Admitting: Otolaryngology

## 2020-03-04 VITALS — Temp 97.2°F

## 2020-03-04 DIAGNOSIS — H6121 Impacted cerumen, right ear: Secondary | ICD-10-CM

## 2020-03-04 NOTE — Progress Notes (Signed)
HPI: Jason Larson is a 85 y.o. male who presents for evaluation of right ear complaints..  This initially began with right ear pain and discomfort.  He was seen by his PCP and was told that nothing was wrong.  He ultimately went to the ER and was diagnosed with swimmer's ear and had a ear wick placed in the right ear in given antibiotics and drops.  He went to a facility to have it removed but they did not see it.  He still has decreased hearing on the right side although he is having no pain.  Past Medical History:  Diagnosis Date  . A-fib ([place])    03-19-2019 denies palpiations   . CAD (coronary artery disease)    total of 5 stents , 2001 and 2002  . CHF (congestive heart failure) ([place])   . Chronic venous stasis    hx of , 03-19-2019 reports no pain in lower legs and legs are chronically dark brown/red in color   . Gout   . Heart attack ([place])    1992   . High cholesterol   . Hypertension   . TIA (transient ischemic attack) 2009   at this time was restarted on coumadin , denies reoccurrence of TIA sx since that time   . Vertigo    ive got a manuever i do twice a day and that has helped .   Past Surgical History:  Procedure Laterality Date  . BACK SURGERY  2014   middle of my back  . BIOPSY  03/20/2019   Procedure: BIOPSY;  Surgeon: Carol Ada, MD;  [facility];  Service: Endoscopy;;  . CARDIAC CATHETERIZATION N/A 06/22/2016   Procedure: Left Heart Cath and Coronary Angiography;  Surgeon: Charolette Forward, MD;  Location: [facility];  Service: Cardiovascular;  Laterality: N/A;  . CARDIAC CATHETERIZATION    . COLONOSCOPY WITH PROPOFOL N/A 03/20/2019   Procedure: COLONOSCOPY WITH PROPOFOL;  Surgeon: Carol Ada, MD;  [facility];  Service: Endoscopy;  Laterality: N/A;  . ESOPHAGOGASTRODUODENOSCOPY (EGD) WITH PROPOFOL N/A 12/10/2016   Procedure: ESOPHAGOGASTRODUODENOSCOPY (EGD) WITH PROPOFOL;  Surgeon: Carol Ada, MD;  [facility];  Service:  Endoscopy;  Laterality: N/A;  . ESOPHAGOGASTRODUODENOSCOPY (EGD) WITH PROPOFOL N/A 03/20/2019   Procedure: ESOPHAGOGASTRODUODENOSCOPY (EGD) WITH PROPOFOL;  Surgeon: Carol Ada, MD;  [facility];  Service: Endoscopy;  Laterality: N/A;  . POLYPECTOMY  03/20/2019   Procedure: POLYPECTOMY;  Surgeon: Carol Ada, MD;  [facility];  Service: Endoscopy;;  . Azzie Almas DILATION N/A 03/20/2019   Procedure: Azzie Almas DILATION;  Surgeon: Carol Ada, MD;  [facility];  Service: Endoscopy;  Laterality: N/A;  . varicose veins      Social History   Socioeconomic History  . Marital status: Widowed    Spouse name: Not on file  . Number of children: Not on file  . Years of education: Not on file  . Highest education level: Not on file  Occupational History  . Not on file  Tobacco Use  . Smoking status: Former Smoker    Packs/day: 0.75    Years: 37.00    Pack years: 27.75    Quit date: 1990    Years since quitting: 31.6  . Smokeless tobacco: Never Used  Vaping Use  . Vaping Use: Never used  Substance and Sexual Activity  . Alcohol use: No  . Drug use: No  . Sexual activity: Not on file  Other Topics Concern  . Not on file  Social History Narrative  . Not on file   Social Determinants of Health   Financial Resource Strain:   . Difficulty of Paying Living Expenses:   Food Insecurity:   . Worried About Charity fundraiser in the Last Year:   . Arboriculturist in the Last Year:   Transportation Needs:   . Film/video editor (Medical):   Marland Kitchen Lack of Transportation (Non-Medical):   Physical Activity:   . Days of Exercise per Week:   . Minutes of Exercise per Session:   Stress:   . Feeling of Stress :   Social Connections:   . Frequency of Communication with Friends and Family:   . Frequency of Social Gatherings with Friends and Family:   . Attends Religious Services:   . Active Member of Clubs or Organizations:   . Attends Archivist  Meetings:   Marland Kitchen Marital Status:    No family history on file. No Known Allergies Prior to Admission medications   Medication Sig Start Date End Date Taking? Authorizing Provider  acetaminophen (TYLENOL) 650 MG CR tablet Take 650 mg by mouth 2 (two) times daily.   Yes [provider]  allopurinol (ZYLOPRIM) 100 MG tablet Take 100 mg by mouth daily.   Yes [provider]  amLODipine (NORVASC) 10 MG tablet Take 10 mg by mouth daily at 3 pm.    Yes [provider]  aspirin 81 MG tablet Take 81 mg by mouth at bedtime.    Yes [provider]  B Complex CAPS Take 1 capsule by mouth daily.   Yes [provider]  bumetanide (BUMEX) 1 MG tablet Take 1 mg by mouth daily.   Yes [provider]  Carboxymethylcellul-Glycerin (LUBRICATING EYE DROPS OP) Place 1 drop into both eyes daily as needed (irritation).   Yes [provider]  carvedilol (COREG) 6.25 MG tablet Take 6.25 mg by mouth at bedtime.    Yes [provider]  Coenzyme Q10 200 MG capsule Take 200 mg by mouth daily.   Yes [provider]  diphenhydramine-acetaminophen (TYLENOL PM) 25-500 MG TABS tablet Take 1 tablet by mouth at bedtime.   Yes [provider]  gemfibrozil (LOPID) 600 MG tablet Take 600 mg by mouth daily.   Yes [provider]  Javier Docker Oil 500 MG CAPS Take 500 mg by mouth daily.   Yes [provider]  losartan (COZAAR) 100 MG tablet Take 100 mg by mouth daily at 3 pm.    Yes [provider]  magnesium oxide (MAG-OX) 400 MG tablet Take 400 mg by mouth daily.   Yes [provider]  meclizine (ANTIVERT) 25 MG tablet Take 12.5 mg by mouth 3 (three) times daily.    Yes [provider]  pravastatin (PRAVACHOL) 40 MG tablet Take 40 mg by mouth daily.    Yes [provider]  warfarin (COUMADIN) 3 MG tablet Take 3 mg by mouth See admin instructions. Take 3 mg at 4 pm on Mon, Tue, Thurs, Fri, and Sat.  Skip doses on Sun and Wed   Yes [provider]     Positive ROS: Otherwise negative  All other systems have been reviewed and were otherwise negative with the exception of those mentioned in the HPI and as above.  Physical Exam: Constitutional: Alert, well-appearing, no acute distress Ears: External ears without lesions or tenderness.  Left ear canal and left TM are clear.  Right ear canal reveals a dried  ear wick within the ear canal that was removed with forceps.  He also had a small amount of debris and wax that was cleaned with suction.  There is no evidence of active infection and the TM is clear.  On hearing screening with a tuning forks he heard about the same in both ears with good improved hearing in the right ear.  CSF powder was applied to the right side. Nasal: External nose without lesions.. Clear nasal passages Oral: Lips and gums without lesions. Tongue and palate mucosa without lesions. Posterior oropharynx clear. Neck: No palpable adenopathy or masses Respiratory: Breathing comfortably  Skin: No facial/neck lesions or rash noted.  Cerumen impaction removal  Date/Time: 03/04/2020 6:15 PM Performed by: Rozetta Nunnery, MD Authorized by: Rozetta Nunnery, MD   Consent:    Consent obtained:  Verbal   Consent given by:  Patient   Risks discussed:  Pain and bleeding Procedure details:    Location:  R ear   Procedure type: suction and forceps   Post-procedure details:    Inspection:  TM intact and canal normal   Hearing quality:  Improved   Patient tolerance of procedure:  Tolerated well, no immediate complications Comments:     An ear wick and a small amount of debris and wax was removed from the right ear canal.  The TM was clear.  Resolved external otitis.    Assessment: Right ear wick was removed in the office today and the right ear was cleaned with suction with resolution of his symptoms.  Plan: He will follow-up as needed.  Radene Journey, MD

## 2020-04-18 ENCOUNTER — Other Ambulatory Visit: Payer: Self-pay

## 2020-04-18 ENCOUNTER — Ambulatory Visit (INDEPENDENT_AMBULATORY_CARE_PROVIDER_SITE_OTHER): Payer: Medicare Other | Admitting: Otolaryngology

## 2020-04-18 VITALS — Temp 97.3°F

## 2020-04-18 DIAGNOSIS — H60311 Diffuse otitis externa, right ear: Secondary | ICD-10-CM

## 2020-04-18 NOTE — Progress Notes (Signed)
HPI: Jason Larson is a 85 y.o. male who returns today for evaluation of right ear.  I had seen him previously for a swimmer's ear.  He has used alcohol vinegar ear rinses which seemed to help some but has itching and crusting mostly in the right ear.  He was also prescribed Ciprodex eardrops but these did not seem to help much and was very expensive..  Past Medical History:  Diagnosis Date  . A-fib ([place])    03-19-2019 denies palpiations   . CAD (coronary artery disease)    total of 5 stents , 2001 and 2002  . CHF (congestive heart failure) ([place])   . Chronic venous stasis    hx of , 03-19-2019 reports no pain in lower legs and legs are chronically dark brown/red in color   . Gout   . Heart attack ([place])    1992   . High cholesterol   . Hypertension   . TIA (transient ischemic attack) 2009   at this time was restarted on coumadin , denies reoccurrence of TIA sx since that time   . Vertigo    ive got a manuever i do twice a day and that has helped .   Past Surgical History:  Procedure Laterality Date  . BACK SURGERY  2014   middle of my back  . BIOPSY  03/20/2019   Procedure: BIOPSY;  Surgeon: Carol Ada, MD;  [facility];  Service: Endoscopy;;  . CARDIAC CATHETERIZATION N/A 06/22/2016   Procedure: Left Heart Cath and Coronary Angiography;  Surgeon: Charolette Forward, MD;  Location: [facility];  Service: Cardiovascular;  Laterality: N/A;  . CARDIAC CATHETERIZATION    . COLONOSCOPY WITH PROPOFOL N/A 03/20/2019   Procedure: COLONOSCOPY WITH PROPOFOL;  Surgeon: Carol Ada, MD;  [facility];  Service: Endoscopy;  Laterality: N/A;  . ESOPHAGOGASTRODUODENOSCOPY (EGD) WITH PROPOFOL N/A 12/10/2016   Procedure: ESOPHAGOGASTRODUODENOSCOPY (EGD) WITH PROPOFOL;  Surgeon: Carol Ada, MD;  [facility];  Service: Endoscopy;  Laterality: N/A;  . ESOPHAGOGASTRODUODENOSCOPY (EGD) WITH PROPOFOL N/A 03/20/2019   Procedure: ESOPHAGOGASTRODUODENOSCOPY (EGD) WITH  PROPOFOL;  Surgeon: Carol Ada, MD;  [facility];  Service: Endoscopy;  Laterality: N/A;  . POLYPECTOMY  03/20/2019   Procedure: POLYPECTOMY;  Surgeon: Carol Ada, MD;  [facility];  Service: Endoscopy;;  . Azzie Almas DILATION N/A 03/20/2019   Procedure: Azzie Almas DILATION;  Surgeon: Carol Ada, MD;  [facility];  Service: Endoscopy;  Laterality: N/A;  . varicose veins      Social History   Socioeconomic History  . Marital status: Widowed    Spouse name: Not on file  . Number of children: Not on file  . Years of education: Not on file  . Highest education level: Not on file  Occupational History  . Not on file  Tobacco Use  . Smoking status: Former Smoker    Packs/day: 0.75    Years: 37.00    Pack years: 27.75    Quit date: 1990    Years since quitting: 31.7  . Smokeless tobacco: Never Used  Vaping Use  . Vaping Use: Never used  Substance and Sexual Activity  . Alcohol use: No  . Drug use: No  . Sexual activity: Not on file  Other Topics Concern  . Not on file  Social History Narrative  . Not on file   Social Determinants of Health   Financial Resource Strain:   . Difficulty of Paying Living Expenses: Not on file  Food  Insecurity:   . Worried About Charity fundraiser in the Last Year: Not on file  . Ran Out of Food in the Last Year: Not on file  Transportation Needs:   . Lack of Transportation (Medical): Not on file  . Lack of Transportation (Non-Medical): Not on file  Physical Activity:   . Days of Exercise per Week: Not on file  . Minutes of Exercise per Session: Not on file  Stress:   . Feeling of Stress : Not on file  Social Connections:   . Frequency of Communication with Friends and Family: Not on file  . Frequency of Social Gatherings with Friends and Family: Not on file  . Attends Religious Services: Not on file  . Active Member of Clubs or Organizations: Not on file  . Attends Archivist Meetings: Not on file   . Marital Status: Not on file   No family history on file. No Known Allergies Prior to Admission medications   Medication Sig Start Date End Date Taking? Authorizing Provider  acetaminophen (TYLENOL) 650 MG CR tablet Take 650 mg by mouth 2 (two) times daily.   Yes [provider]  allopurinol (ZYLOPRIM) 100 MG tablet Take 100 mg by mouth daily.   Yes [provider]  amLODipine (NORVASC) 10 MG tablet Take 10 mg by mouth daily at 3 pm.    Yes [provider]  aspirin 81 MG tablet Take 81 mg by mouth at bedtime.    Yes [provider]  B Complex CAPS Take 1 capsule by mouth daily.   Yes [provider]  bumetanide (BUMEX) 1 MG tablet Take 1 mg by mouth daily.   Yes [provider]  Carboxymethylcellul-Glycerin (LUBRICATING EYE DROPS OP) Place 1 drop into both eyes daily as needed (irritation).   Yes [provider]  carvedilol (COREG) 6.25 MG tablet Take 6.25 mg by mouth at bedtime.    Yes [provider]  Coenzyme Q10 200 MG capsule Take 200 mg by mouth daily.   Yes [provider]  diphenhydramine-acetaminophen (TYLENOL PM) 25-500 MG TABS tablet Take 1 tablet by mouth at bedtime.   Yes [provider]  gemfibrozil (LOPID) 600 MG tablet Take 600 mg by mouth daily.   Yes [provider]  Javier Docker Oil 500 MG CAPS Take 500 mg by mouth daily.   Yes [provider]  losartan (COZAAR) 100 MG tablet Take 100 mg by mouth daily at 3 pm.    Yes [provider]  magnesium oxide (MAG-OX) 400 MG tablet Take 400 mg by mouth daily.   Yes [provider]  meclizine (ANTIVERT) 25 MG tablet Take 12.5 mg by mouth 3 (three) times daily.    Yes [provider]  pravastatin (PRAVACHOL) 40 MG tablet Take 40 mg by mouth daily.    Yes [provider]  warfarin (COUMADIN) 3 MG tablet Take 3 mg by mouth See admin instructions. Take 3 mg at 4 pm on Mon, Tue, Thurs, Fri, and Sat.  Skip doses on Sun and Wed   Yes [provider]     Positive ROS: Otherwise negative  All other systems have been reviewed and were otherwise negative with the exception of those mentioned in the HPI and as above.  Physical Exam: Constitutional: Alert, well-appearing, no acute distress Ears: External ears without lesions or tenderness.  Left ear canal and TM are clear with minimal wax buildup and no signs of infection.  The right  ear canal has some crusting and mild external otitis but this was minimal.  The TM itself was clear.  His hearing is doing well.  The right ear canal was cleaned with hydroperoxide and alcohol and after cleaning the ear canal I applied gentian violet, Ciprodex and CSF powder. Nasal: External nose without lesions.. Clear nasal passages Oral: Lips and gums without lesions. Tongue and palate mucosa without lesions. Posterior oropharynx clear. Neck: No palpable adenopathy or masses Respiratory: Breathing comfortably  Skin: No facial/neck lesions or rash noted.  Cerumen impaction removal  Date/Time: 04/18/2020 4:33 PM Performed by: Rozetta Nunnery, MD Authorized by: Rozetta Nunnery, MD   Consent:    Consent obtained:  Verbal   Consent given by:  Patient   Risks discussed:  Pain and bleeding Procedure details:    Location:  R ear   Procedure type: irrigation and suction   Post-procedure details:    Inspection:  TM intact and canal normal   Hearing quality:  Improved   Patient tolerance of procedure:  Tolerated well, no immediate complications Comments:     Right ear canal had minimal inflammatory changes that was cleaned with irrigation with alcohol and hydroperoxide.  After cleaning the ear canal I applied gentian violet, Ciprodex and CSF powder to the right ear canal only.    Assessment: Right external otitis.  Plan: Recommend keeping the ear canal dry and not apply any eardrops at this point. If he has any drainage or itching  recommend use of alcohol vinegar eardrops.  He will follow-up if he has any further problems with the right ear.   Radene Journey, MD

## 2020-07-17 ENCOUNTER — Other Ambulatory Visit: Payer: Self-pay

## 2020-07-17 ENCOUNTER — Ambulatory Visit (INDEPENDENT_AMBULATORY_CARE_PROVIDER_SITE_OTHER): Payer: Medicare Other | Admitting: Otolaryngology

## 2020-07-17 ENCOUNTER — Encounter (INDEPENDENT_AMBULATORY_CARE_PROVIDER_SITE_OTHER): Payer: Self-pay | Admitting: Otolaryngology

## 2020-07-17 VITALS — Temp 97.3°F

## 2020-07-17 DIAGNOSIS — H60311 Diffuse otitis externa, right ear: Secondary | ICD-10-CM

## 2020-07-17 DIAGNOSIS — H6121 Impacted cerumen, right ear: Secondary | ICD-10-CM

## 2020-07-17 NOTE — Progress Notes (Signed)
HPI: Jason Larson is a 85 y.o. male who returns today for evaluation of ear problems.  He was last seen 3 months ago and had the ear canals cleaned at that time had a right external otitis.  He still has a little itching in both ear canals but has blockage of the right ear canal..  Past Medical History:  Diagnosis Date  . A-fib ([place])    03-19-2019 denies palpiations   . CAD (coronary artery disease)    total of 5 stents , 2001 and 2002  . CHF (congestive heart failure) ([place])   . Chronic venous stasis    hx of , 03-19-2019 reports no pain in lower legs and legs are chronically dark brown/red in color   . Gout   . Heart attack ([place])    1992   . High cholesterol   . Hypertension   . TIA (transient ischemic attack) 2009   at this time was restarted on coumadin , denies reoccurrence of TIA sx since that time   . Vertigo    ive got a manuever i do twice a day and that has helped .   Past Surgical History:  Procedure Laterality Date  . BACK SURGERY  2014   middle of my back  . BIOPSY  03/20/2019   Procedure: BIOPSY;  Surgeon: Carol Ada, MD;  [facility];  Service: Endoscopy;;  . CARDIAC CATHETERIZATION N/A 06/22/2016   Procedure: Left Heart Cath and Coronary Angiography;  Surgeon: Charolette Forward, MD;  Location: [facility];  Service: Cardiovascular;  Laterality: N/A;  . CARDIAC CATHETERIZATION    . COLONOSCOPY WITH PROPOFOL N/A 03/20/2019   Procedure: COLONOSCOPY WITH PROPOFOL;  Surgeon: Carol Ada, MD;  [facility];  Service: Endoscopy;  Laterality: N/A;  . ESOPHAGOGASTRODUODENOSCOPY (EGD) WITH PROPOFOL N/A 12/10/2016   Procedure: ESOPHAGOGASTRODUODENOSCOPY (EGD) WITH PROPOFOL;  Surgeon: Carol Ada, MD;  [facility];  Service: Endoscopy;  Laterality: N/A;  . ESOPHAGOGASTRODUODENOSCOPY (EGD) WITH PROPOFOL N/A 03/20/2019   Procedure: ESOPHAGOGASTRODUODENOSCOPY (EGD) WITH PROPOFOL;  Surgeon: Carol Ada, MD;  [facility];  Service:  Endoscopy;  Laterality: N/A;  . POLYPECTOMY  03/20/2019   Procedure: POLYPECTOMY;  Surgeon: Carol Ada, MD;  [facility];  Service: Endoscopy;;  . Azzie Almas DILATION N/A 03/20/2019   Procedure: Azzie Almas DILATION;  Surgeon: Carol Ada, MD;  [facility];  Service: Endoscopy;  Laterality: N/A;  . varicose veins      Social History   Socioeconomic History  . Marital status: Widowed    Spouse name: Not on file  . Number of children: Not on file  . Years of education: Not on file  . Highest education level: Not on file  Occupational History  . Not on file  Tobacco Use  . Smoking status: Former Smoker    Packs/day: 0.75    Years: 37.00    Pack years: 27.75    Quit date: 1990    Years since quitting: 31.9  . Smokeless tobacco: Never Used  Vaping Use  . Vaping Use: Never used  Substance and Sexual Activity  . Alcohol use: No  . Drug use: No  . Sexual activity: Not on file  Other Topics Concern  . Not on file  Social History Narrative  . Not on file   Social Determinants of Health   Financial Resource Strain: Not on file  Food Insecurity: Not on file  Transportation Needs: Not on file  Physical Activity: Not on file  Stress: Not  on file  Social Connections: Not on file   No family history on file. No Known Allergies Prior to Admission medications   Medication Sig Start Date End Date Taking? Authorizing Provider  acetaminophen (TYLENOL) 650 MG CR tablet Take 650 mg by mouth 2 (two) times daily.    [provider]  allopurinol (ZYLOPRIM) 100 MG tablet Take 100 mg by mouth daily.    [provider]  amLODipine (NORVASC) 10 MG tablet Take 10 mg by mouth daily at 3 pm.     [provider]  aspirin 81 MG tablet Take 81 mg by mouth at bedtime.     [provider]  B Complex CAPS Take 1 capsule by mouth daily.    [provider]  bumetanide (BUMEX) 1 MG tablet Take 1 mg by mouth daily.    [provider]   Carboxymethylcellul-Glycerin (LUBRICATING EYE DROPS OP) Place 1 drop into both eyes daily as needed (irritation).    [provider]  carvedilol (COREG) 6.25 MG tablet Take 6.25 mg by mouth at bedtime.     [provider]  Coenzyme Q10 200 MG capsule Take 200 mg by mouth daily.    [provider]  diphenhydramine-acetaminophen (TYLENOL PM) 25-500 MG TABS tablet Take 1 tablet by mouth at bedtime.    [provider]  gemfibrozil (LOPID) 600 MG tablet Take 600 mg by mouth daily.    [provider]  Javier Docker Oil 500 MG CAPS Take 500 mg by mouth daily.    [provider]  losartan (COZAAR) 100 MG tablet Take 100 mg by mouth daily at 3 pm.     [provider]  magnesium oxide (MAG-OX) 400 MG tablet Take 400 mg by mouth daily.    [provider]  meclizine (ANTIVERT) 25 MG tablet Take 12.5 mg by mouth 3 (three) times daily.     [provider]  pravastatin (PRAVACHOL) 40 MG tablet Take 40 mg by mouth daily.     [provider]  warfarin (COUMADIN) 3 MG tablet Take 3 mg by mouth See admin instructions. Take 3 mg at 4 pm on Mon, Tue, Thurs, Fri, and Sat. Skip doses on Sun and Wed    [provider]     Positive ROS: Otherwise negative  All other systems have been reviewed and were otherwise negative with the exception of those mentioned in the HPI and as above.  Physical Exam: Constitutional: Alert, well-appearing, no acute distress Ears: External ears without lesions or tenderness.  Left ear canal is relatively clear with minimal wax buildup that was nonobstructing.  The TM was clear.  No external otitis.  The right ear canal was occluded with cerumen as well some mild inflammatory changes of the ear canal.  After cleaning the ear canal with hydroperoxide and suction.  The TM itself was clear with good mobility on pneumatic otoscopy.  I applied gentian violet, Floxin and CSF powder to the right ear.  He also  has some mild skin changes in the concha. Nasal: External nose without lesions. Clear nasal passages Oral: Lips and gums without lesions. Tongue and palate mucosa without lesions. Posterior oropharynx clear. Neck: No palpable adenopathy or masses Respiratory: Breathing comfortably  Skin: No facial/neck lesions or rash noted.  Cerumen impaction removal  Date/Time: 07/17/2020 12:53 PM Performed by: Rozetta Nunnery, MD Authorized by: Rozetta Nunnery, MD   Consent:    Consent obtained:  Verbal   Consent given by:  Patient   Risks discussed:  Pain and bleeding Procedure details:    Location:  R ear   Procedure type: curette and suction   Post-procedure details:    Inspection:  TM intact and canal normal   Hearing quality:  Improved   Patient tolerance of procedure:  Tolerated well, no immediate complications Comments:     Right ear canal with a large amount of wax as well as chronic right external otitis.  After cleaning the ear canal I applied Floxin and gentian violet and CSF powder to the right ear only.    Assessment: Chronic problems with wax in right chronic external otitis.  Plan: After cleaning the ear canals I discussed with him concerning keeping the ears dry as possible.  Suggested use of hydrocortisone cream in the concha area if he is having much itching or scaling. He will follow-up in 3 months for recheck.   Radene Journey, MD

## 2020-08-13 ENCOUNTER — Emergency Department (HOSPITAL_COMMUNITY)
Admission: EM | Admit: 2020-08-13 | Discharge: 2020-08-26 | Disposition: E | Payer: Medicare Other | Attending: Emergency Medicine | Admitting: Emergency Medicine

## 2020-08-13 DIAGNOSIS — Z7901 Long term (current) use of anticoagulants: Secondary | ICD-10-CM | POA: Insufficient documentation

## 2020-08-13 DIAGNOSIS — I110 Hypertensive heart disease with heart failure: Secondary | ICD-10-CM | POA: Diagnosis not present

## 2020-08-13 DIAGNOSIS — Z7982 Long term (current) use of aspirin: Secondary | ICD-10-CM | POA: Diagnosis not present

## 2020-08-13 DIAGNOSIS — I469 Cardiac arrest, cause unspecified: Secondary | ICD-10-CM | POA: Diagnosis not present

## 2020-08-13 DIAGNOSIS — Z87891 Personal history of nicotine dependence: Secondary | ICD-10-CM | POA: Insufficient documentation

## 2020-08-13 DIAGNOSIS — Z79899 Other long term (current) drug therapy: Secondary | ICD-10-CM | POA: Insufficient documentation

## 2020-08-13 DIAGNOSIS — I2510 Atherosclerotic heart disease of native coronary artery without angina pectoris: Secondary | ICD-10-CM | POA: Insufficient documentation

## 2020-08-13 DIAGNOSIS — I509 Heart failure, unspecified: Secondary | ICD-10-CM | POA: Diagnosis not present

## 2020-08-26 NOTE — ED Notes (Signed)
Pt to ED via [facility] from home- Girlfriend saw pt standing in the yard then laying in yard -- pulseless and apneic on Fire and EMS arrival-- Intubated in the field, Fine VF -- shocked x 4, received Epi x 5 Amio 500mg ,  On arrival to ED- pulseless-- asystole- apneic, no cardiac activity on ultrasound.  Time of death per Dr. Melina Copa -- 1506.  Not an ME case, Not suitable for organ/tissue donation

## 2020-08-26 NOTE — ED Notes (Signed)
Please call wife as soon as you can with patient status. Carol's number is [phone].

## 2020-08-26 NOTE — ED Provider Notes (Signed)
[facility] EMERGENCY DEPARTMENT Provider Note   CSN: [id number] Arrival date & time: 09-04-2020  1508     History Chief Complaint  Patient presents with  . Cardiac Arrest    Jason Larson is a 86 y.o. male.  History of cardiac disease.  Was outside observed by wife to have collapsed on the ground.  CPR begun within 5 minutes.  ALS initiated at 1423.  V. fib shocked x4.  6 epi 300 mg amiodarone.  CPR with rhythm PEA or asystole.  Few agonal breaths otherwise no neurologic signs.  Orally intubated with [facility] airway.  Level 5 caveat secondary to acuity of condition  The history is provided by the EMS personnel.  Cardiac Arrest Witnessed by:  Not witnessed Incident location: outside home. Time before BLS initiated:  3-5 minutes Condition upon EMS arrival:  Unresponsive Pulse:  Absent Initial cardiac rhythm per EMS:  Ventricular fibrillation Treatments prior to arrival:  ACLS protocol, intubation and vascular access Medications given prior to ED:  Adenosine and epinephrine IV access type:  Peripheral Airway:  Combitube Rhythm on admission to ED:  Asystole Risk factors: heart problem        Past Medical History:  Diagnosis Date  . A-fib ([place])    03-19-2019 denies palpiations   . CAD (coronary artery disease)    total of 5 stents , 2001 and 2002  . CHF (congestive heart failure) ([place])   . Chronic venous stasis    hx of , 03-19-2019 reports no pain in lower legs and legs are chronically dark brown/red in color   . Gout   . Heart attack ([place])    1992   . High cholesterol   . Hypertension   . TIA (transient ischemic attack) 2009   at this time was restarted on coumadin , denies reoccurrence of TIA sx since that time   . Vertigo    ive got a manuever i do twice a day and that has helped .    Patient Active Problem List   Diagnosis Date Noted  . Acute swimmer's ear of right side 02/21/2020    Past Surgical History:  Procedure Laterality Date  . BACK  SURGERY  2014   middle of my back  . BIOPSY  03/20/2019   Procedure: BIOPSY;  Surgeon: Carol Ada, MD;  Location: [facility];  Service: Endoscopy;;  . CARDIAC CATHETERIZATION N/A 06/22/2016   Procedure: Left Heart Cath and Coronary Angiography;  Surgeon: Charolette Forward, MD;  Location: [facility];  Service: Cardiovascular;  Laterality: N/A;  . CARDIAC CATHETERIZATION    . COLONOSCOPY WITH PROPOFOL N/A 03/20/2019   Procedure: COLONOSCOPY WITH PROPOFOL;  Surgeon: Carol Ada, MD;  Location: [facility];  Service: Endoscopy;  Laterality: N/A;  . ESOPHAGOGASTRODUODENOSCOPY (EGD) WITH PROPOFOL N/A 12/10/2016   Procedure: ESOPHAGOGASTRODUODENOSCOPY (EGD) WITH PROPOFOL;  Surgeon: Carol Ada, MD;  Location: [facility];  Service: Endoscopy;  Laterality: N/A;  . ESOPHAGOGASTRODUODENOSCOPY (EGD) WITH PROPOFOL N/A 03/20/2019   Procedure: ESOPHAGOGASTRODUODENOSCOPY (EGD) WITH PROPOFOL;  Surgeon: Carol Ada, MD;  Location: [facility];  Service: Endoscopy;  Laterality: N/A;  . POLYPECTOMY  03/20/2019   Procedure: POLYPECTOMY;  Surgeon: Carol Ada, MD;  Location: [facility];  Service: Endoscopy;;  . Azzie Almas DILATION N/A 03/20/2019   Procedure: Azzie Almas DILATION;  Surgeon: Carol Ada, MD;  Location: [facility];  Service: Endoscopy;  Laterality: N/A;  . varicose veins          No family history on file.  Social  History   Tobacco Use  . Smoking status: Former Smoker    Packs/day: 0.75    Years: 37.00    Pack years: 27.75    Quit date: 1990    Years since quitting: 32.0  . Smokeless tobacco: Never Used  Vaping Use  . Vaping Use: Never used  Substance Use Topics  . Alcohol use: No  . Drug use: No    Home Medications Prior to Admission medications   Medication Sig Start Date End Date Taking? Authorizing Provider  acetaminophen (TYLENOL) 650 MG CR tablet Take 650 mg by mouth 2 (two) times daily.    [provider]  allopurinol (ZYLOPRIM) 100 MG tablet Take 100  mg by mouth daily.    [provider]  amLODipine (NORVASC) 10 MG tablet Take 10 mg by mouth daily at 3 pm.     [provider]  aspirin 81 MG tablet Take 81 mg by mouth at bedtime.     [provider]  B Complex CAPS Take 1 capsule by mouth daily.    [provider]  bumetanide (BUMEX) 1 MG tablet Take 1 mg by mouth daily.    [provider]  Carboxymethylcellul-Glycerin (LUBRICATING EYE DROPS OP) Place 1 drop into both eyes daily as needed (irritation).    [provider]  carvedilol (COREG) 6.25 MG tablet Take 6.25 mg by mouth at bedtime.     [provider]  Coenzyme Q10 200 MG capsule Take 200 mg by mouth daily.    [provider]  diphenhydramine-acetaminophen (TYLENOL PM) 25-500 MG TABS tablet Take 1 tablet by mouth at bedtime.    [provider]  gemfibrozil (LOPID) 600 MG tablet Take 600 mg by mouth daily.    [provider]  Javier Docker Oil 500 MG CAPS Take 500 mg by mouth daily.    [provider]  losartan (COZAAR) 100 MG tablet Take 100 mg by mouth daily at 3 pm.     [provider]  magnesium oxide (MAG-OX) 400 MG tablet Take 400 mg by mouth daily.    [provider]  meclizine (ANTIVERT) 25 MG tablet Take 12.5 mg by mouth 3 (three) times daily.     [provider]  pravastatin (PRAVACHOL) 40 MG tablet Take 40 mg by mouth daily.     [provider]  warfarin (COUMADIN) 3 MG tablet Take 3 mg by mouth See admin instructions. Take 3 mg at 4 pm on Mon, Tue, Thurs, Fri, and Sat. Skip doses on Sun and Wed    [provider]    Allergies    Patient has no known allergies.  Review of Systems   Review of Systems  Unable to perform ROS: Patient unresponsive    Physical Exam Updated Vital Signs Ht 5\' 7"  (1.702 m)   Wt 93.9 kg   BMI 32.42 kg/m   Physical Exam Vitals and nursing note reviewed.  Constitutional:      Comments: Unresponsive   HENT:     Head: Normocephalic and atraumatic.     Mouth/Throat:     Comments: Orally intubated King airway blood in the tube Eyes:     Comments: Pupils fixed and dilated  Neck:     Comments: In cervical collar trach midline. Cardiovascular:     Comments: No pulses no heart sounds Pulmonary:     Comments: Equal breath sounds by ambu Abdominal:     Palpations: Abdomen is soft. There is no mass.  Musculoskeletal:  General: No deformity or signs of injury.  Skin:    Comments: Pale and cool  Neurological:     Comments: No neurologic activities.  Minimal agonal breath, no response to pain     ED Results / Procedures / Treatments   Labs (all labs ordered are listed, but only abnormal results are displayed) Labs Reviewed - No data to display  EKG None  Radiology No results found.  Procedures Procedures (including critical care time)  Medications Ordered in ED Medications - No data to display  ED Course  I have reviewed the triage vital signs and the nursing notes.  Pertinent labs & imaging results that were available during my care of the patient were reviewed by me and considered in my medical decision making (see chart for details).  Clinical Course as of 08/14/20 1206  Wed Sep 05, 2020 Spoke with the patient's partner Olena Mater on the phone.  She said his family is all in the [place]. [MB]  [id number] Discussed with medical examiner Sabra Heck who said this is not an ME case. [MB]    Clinical Course User Index [MB] Hayden Rasmussen, MD   MDM Rules/Calculators/A&P                         Bedside ultrasound showing no cardiac activity.  Due to the fact that we have had about 30 minutes of ALS intervention with no ROSC patient was pronounced dead at 1506.  Final Clinical Impression(s) / ED Diagnoses Final diagnoses:  Cardiac arrest [facility])    Rx / DC Orders ED Discharge Orders    None       Hayden Rasmussen, MD 08/14/20 1207

## 2020-08-26 DEATH — deceased

## 2020-10-17 ENCOUNTER — Ambulatory Visit (INDEPENDENT_AMBULATORY_CARE_PROVIDER_SITE_OTHER): Payer: Medicare Other | Admitting: Otolaryngology
